# Patient Record
Sex: Male | Born: 1935 | Race: White | Hispanic: No | Smoking: Never smoker
Health system: Southern US, Community
[De-identification: ages and names within clinical notes are randomized; demographics above are authoritative.]

## PROBLEM LIST (undated history)

## (undated) DIAGNOSIS — E7800 Pure hypercholesterolemia, unspecified: Secondary | ICD-10-CM

## (undated) DIAGNOSIS — Z720 Tobacco use: Secondary | ICD-10-CM

---

## 2000-11-27 ENCOUNTER — Encounter: Payer: Self-pay | Admitting: Cardiology

## 2000-11-27 ENCOUNTER — Inpatient Hospital Stay (HOSPITAL_COMMUNITY): Admission: EM | Admit: 2000-11-27 | Discharge: 2000-12-01 | Payer: Self-pay | Admitting: Emergency Medicine

## 2003-06-21 ENCOUNTER — Ambulatory Visit (HOSPITAL_COMMUNITY): Admission: RE | Admit: 2003-06-21 | Discharge: 2003-06-21 | Payer: Self-pay | Admitting: *Deleted

## 2004-07-01 ENCOUNTER — Ambulatory Visit: Payer: Self-pay | Admitting: *Deleted

## 2004-07-24 ENCOUNTER — Ambulatory Visit: Payer: Self-pay | Admitting: *Deleted

## 2005-02-05 ENCOUNTER — Ambulatory Visit: Payer: Self-pay | Admitting: *Deleted

## 2005-02-06 ENCOUNTER — Ambulatory Visit: Payer: Self-pay | Admitting: *Deleted

## 2005-10-31 ENCOUNTER — Ambulatory Visit: Payer: Self-pay | Admitting: *Deleted

## 2005-11-13 ENCOUNTER — Ambulatory Visit: Payer: Self-pay | Admitting: *Deleted

## 2006-11-25 ENCOUNTER — Ambulatory Visit: Payer: Self-pay | Admitting: Cardiology

## 2006-11-25 ENCOUNTER — Ambulatory Visit (HOSPITAL_COMMUNITY): Admission: RE | Admit: 2006-11-25 | Discharge: 2006-11-25 | Payer: Self-pay | Admitting: Family Medicine

## 2006-12-04 ENCOUNTER — Ambulatory Visit: Payer: Self-pay | Admitting: Cardiology

## 2007-03-08 ENCOUNTER — Ambulatory Visit: Payer: Self-pay | Admitting: Cardiology

## 2007-05-20 ENCOUNTER — Observation Stay (HOSPITAL_COMMUNITY): Admission: EM | Admit: 2007-05-20 | Discharge: 2007-05-21 | Payer: Self-pay | Admitting: Emergency Medicine

## 2007-05-24 ENCOUNTER — Ambulatory Visit: Payer: Self-pay | Admitting: Cardiology

## 2007-05-24 ENCOUNTER — Inpatient Hospital Stay (HOSPITAL_COMMUNITY): Admission: AD | Admit: 2007-05-24 | Discharge: 2007-05-28 | Payer: Self-pay | Admitting: Cardiology

## 2007-06-14 ENCOUNTER — Ambulatory Visit: Payer: Self-pay | Admitting: Cardiology

## 2008-10-17 ENCOUNTER — Ambulatory Visit: Payer: Self-pay | Admitting: Cardiology

## 2008-10-26 ENCOUNTER — Ambulatory Visit (HOSPITAL_COMMUNITY): Admission: RE | Admit: 2008-10-26 | Discharge: 2008-10-26 | Payer: Self-pay | Admitting: Cardiology

## 2008-10-27 ENCOUNTER — Ambulatory Visit: Payer: Self-pay | Admitting: Cardiology

## 2008-11-16 ENCOUNTER — Ambulatory Visit: Payer: Self-pay | Admitting: Cardiology

## 2008-12-12 ENCOUNTER — Inpatient Hospital Stay (HOSPITAL_COMMUNITY): Admission: EM | Admit: 2008-12-12 | Discharge: 2008-12-15 | Payer: Self-pay | Admitting: Emergency Medicine

## 2008-12-18 ENCOUNTER — Encounter (INDEPENDENT_AMBULATORY_CARE_PROVIDER_SITE_OTHER): Payer: Self-pay | Admitting: Interventional Radiology

## 2008-12-18 ENCOUNTER — Ambulatory Visit (HOSPITAL_COMMUNITY): Admission: RE | Admit: 2008-12-18 | Discharge: 2008-12-18 | Payer: Self-pay | Admitting: Interventional Radiology

## 2009-03-11 ENCOUNTER — Ambulatory Visit: Payer: Self-pay | Admitting: Cardiology

## 2009-03-11 ENCOUNTER — Inpatient Hospital Stay (HOSPITAL_COMMUNITY): Admission: EM | Admit: 2009-03-11 | Discharge: 2009-03-16 | Payer: Self-pay | Admitting: Emergency Medicine

## 2009-03-18 ENCOUNTER — Ambulatory Visit: Payer: Self-pay | Admitting: Cardiology

## 2009-03-18 ENCOUNTER — Inpatient Hospital Stay (HOSPITAL_COMMUNITY): Admission: EM | Admit: 2009-03-18 | Discharge: 2009-03-30 | Payer: Self-pay | Admitting: Emergency Medicine

## 2009-03-19 ENCOUNTER — Encounter (INDEPENDENT_AMBULATORY_CARE_PROVIDER_SITE_OTHER): Payer: Self-pay | Admitting: Family Medicine

## 2009-03-19 ENCOUNTER — Encounter: Payer: Self-pay | Admitting: Cardiology

## 2009-03-30 ENCOUNTER — Inpatient Hospital Stay: Admission: AD | Admit: 2009-03-30 | Discharge: 2009-06-15 | Payer: Self-pay | Admitting: Family Medicine

## 2009-04-04 ENCOUNTER — Ambulatory Visit (HOSPITAL_COMMUNITY): Admission: RE | Admit: 2009-04-04 | Discharge: 2009-04-04 | Payer: Self-pay | Admitting: Family Medicine

## 2010-02-26 ENCOUNTER — Inpatient Hospital Stay (HOSPITAL_COMMUNITY): Admission: EM | Admit: 2010-02-26 | Discharge: 2010-03-04 | Payer: Self-pay | Admitting: Emergency Medicine

## 2010-03-01 ENCOUNTER — Encounter: Payer: Self-pay | Admitting: Orthopaedic Surgery

## 2010-03-04 ENCOUNTER — Inpatient Hospital Stay: Admission: AD | Admit: 2010-03-04 | Discharge: 2010-03-28 | Payer: Self-pay | Admitting: Family Medicine

## 2010-06-02 IMAGING — CR DG CHEST 1V PORT
1 series · 1 of 1 positions shown · non-contrast
Comparison: 03/18/2009 and earlier.

CLINICAL DATA: [AGE] male with pleural effusions and edema.
Septic shock.

PORTABLE CHEST - 1 VIEW

[view not recorded]
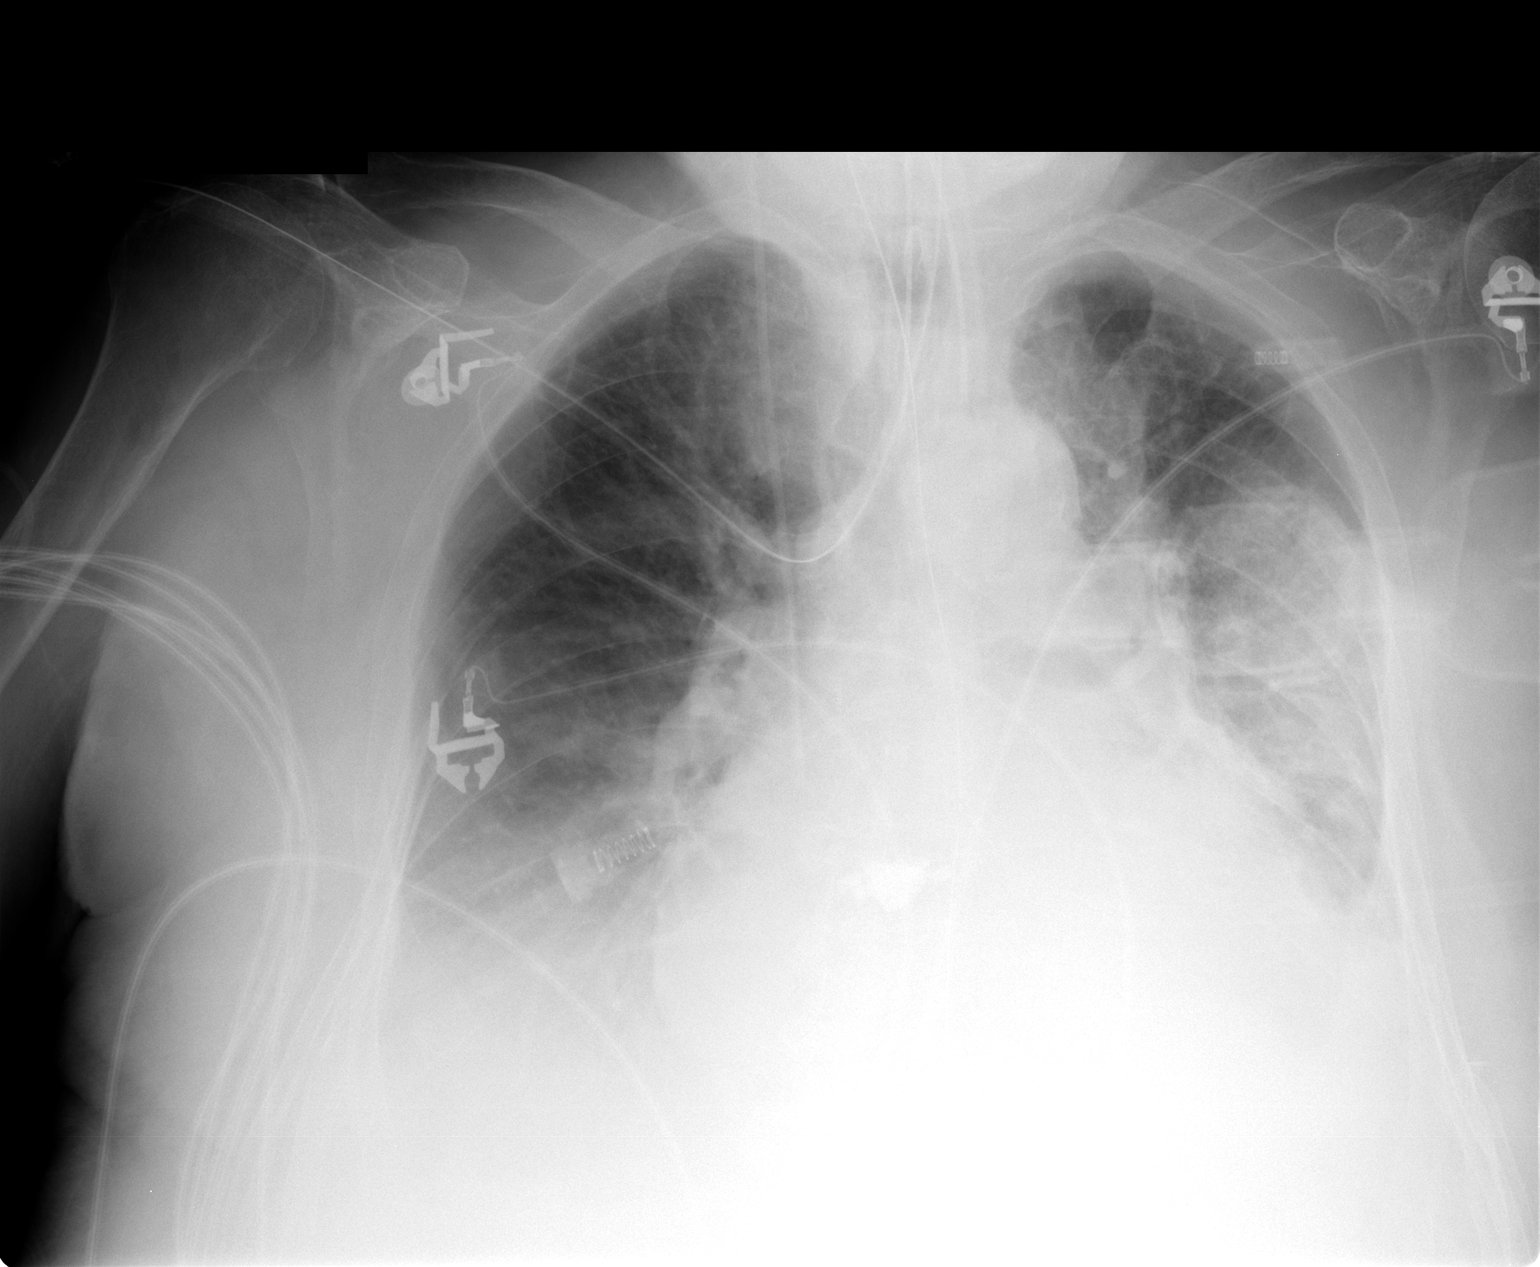

[1 of 1 positions shown; findings below may reference images not displayed]

FINDINGS: Portable semi upright AP view at 5400 hours. Stable
cardiomegaly and mediastinal contours.  Endotracheal tube tip
projects at the level of clavicles.  Stable right IJ catheter
allowing for differences in patient positioning.  Enteric tube tip
projects at the level of the distal thoracic esophagus, unchanged.
Bilateral lower lobe collapse and pleural effusions.  No
pneumothorax or definite acute pulmonary edema.
IMPRESSION: 1.  Stable lines and tubes.  Recommend advancing NG tube for more
optimal placement.
2.  Bilateral pleural effusions and lower lobe collapse.

## 2010-06-02 IMAGING — CR DG ABD PORTABLE 1V
1 series · 1 of 1 positions shown · non-contrast
Comparison: None

CLINICAL DATA: Septic shock.  Colitis.

ABDOMEN - 1 VIEW

[view not recorded]
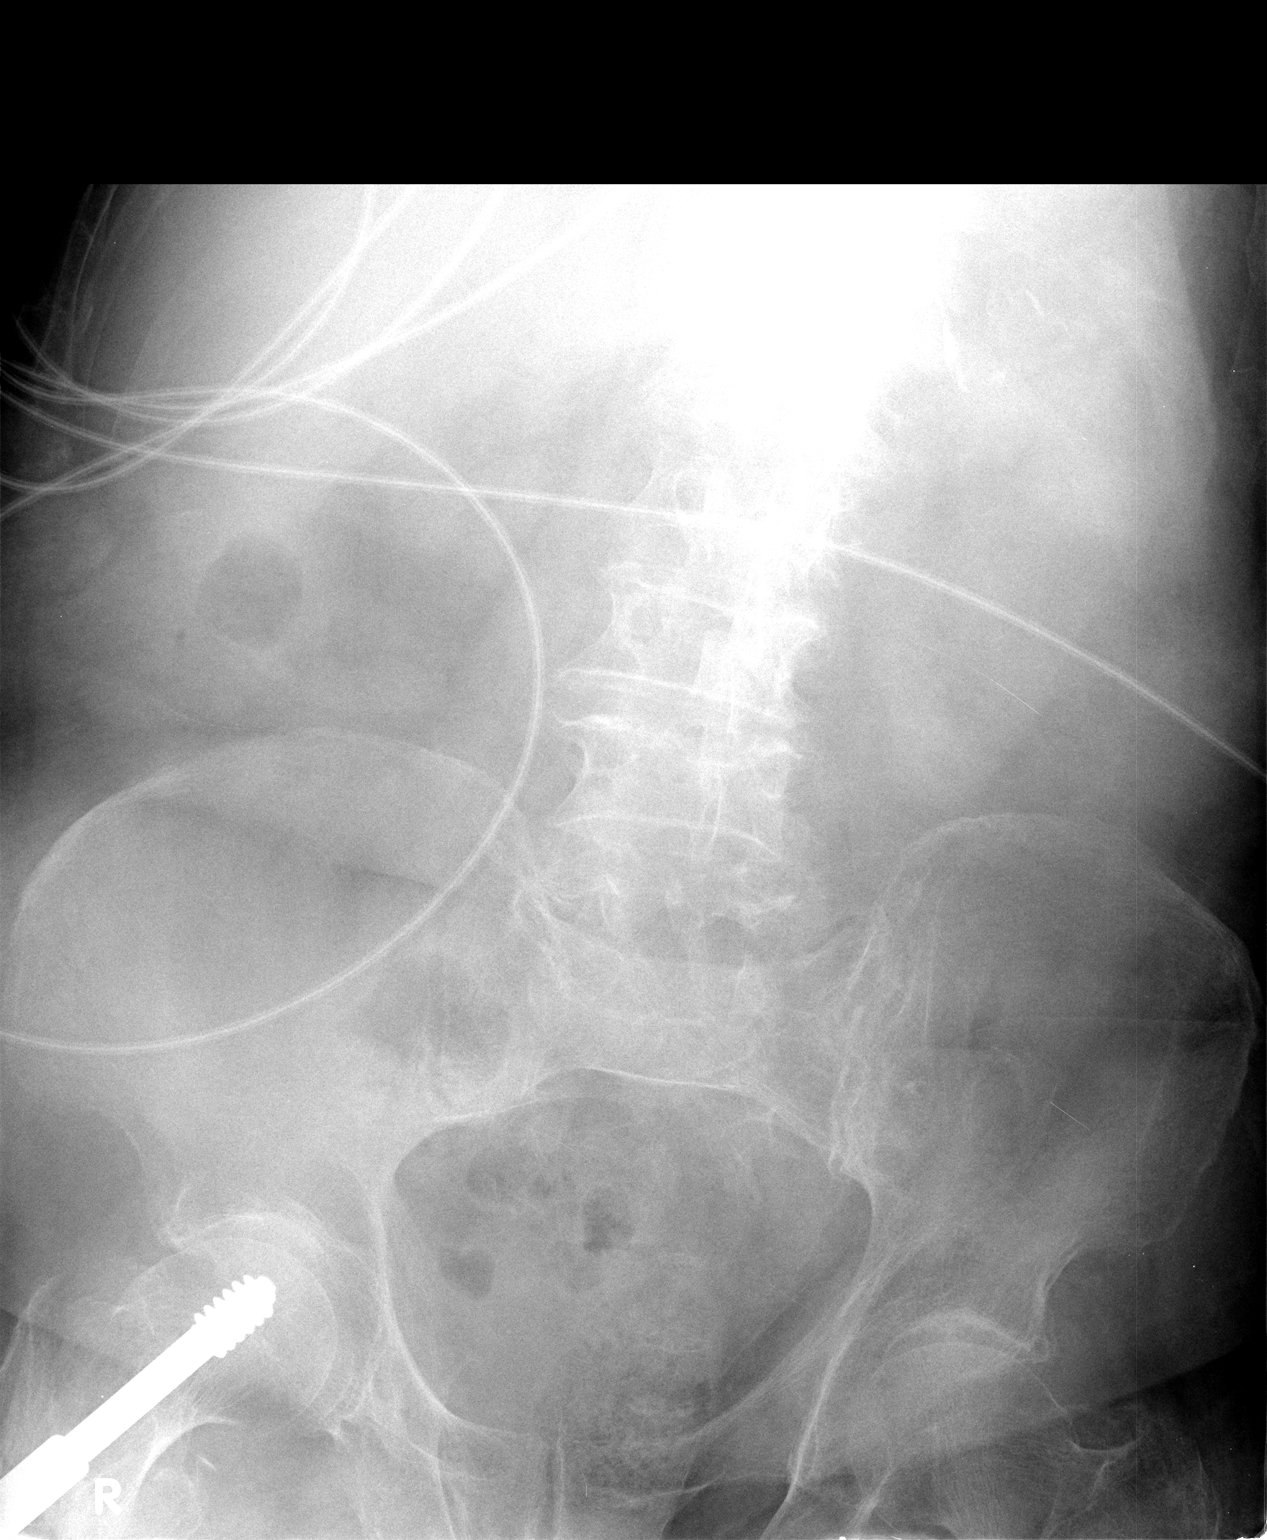

[1 of 1 positions shown; findings below may reference images not displayed]

FINDINGS: There is no evidence of dilated bowel loops.  Vascular
calcification noted within the upper abdomen.  Old T12 vertebral
plasty noted.  Compression screws seen in the right hip.
IMPRESSION: No acute findings.

## 2010-06-04 IMAGING — CR DG CHEST 1V PORT
1 series · 1 of 1 positions shown · non-contrast
Comparison: 03/20/2009.

CLINICAL DATA: Septic shock.

PORTABLE CHEST - 1 VIEW

[view not recorded]
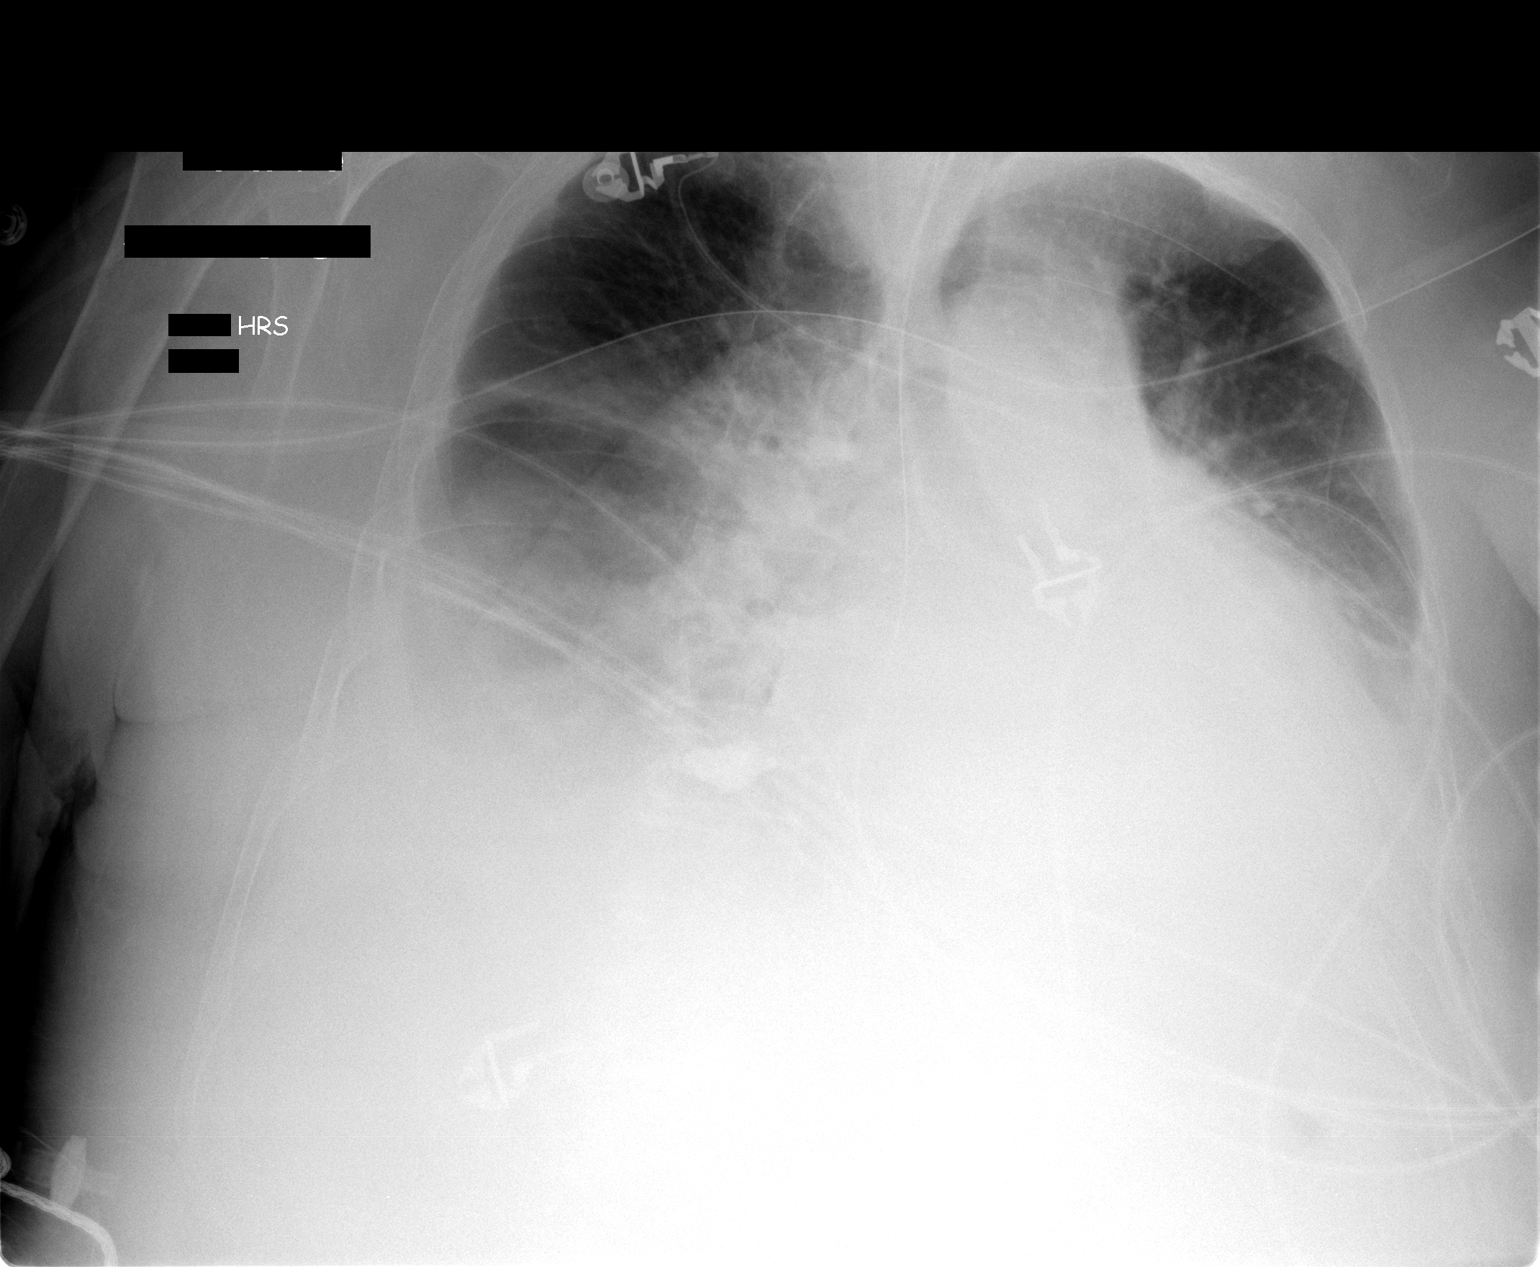

[1 of 1 positions shown; findings below may reference images not displayed]

FINDINGS: Endotracheal tube tip 5.5 cm above the carina.  Right
central line tip there is poorly delineated secondary to rotation
and underpenetration.  Nasogastric tube courses below the
diaphragm.  The tip is not included on this exam.

Cardiomegaly.  Pulmonary vascular congestion.  Consolidation left
lower lobe.  Bilateral pleural effusions.
IMPRESSION: Rotated portable examination.

Cardiomegaly and pulmonary vascular congestion with bilateral
pleural effusions.

Consolidation left base.

## 2010-06-08 IMAGING — CR DG CHEST 1V PORT
1 series · 1 of 1 positions shown · non-contrast
Comparison: 03/22/2009 and 03/20/2009.

CLINICAL DATA: Cough and congestion.  Septic shock.

PORTABLE CHEST - 1 VIEW

[view not recorded]
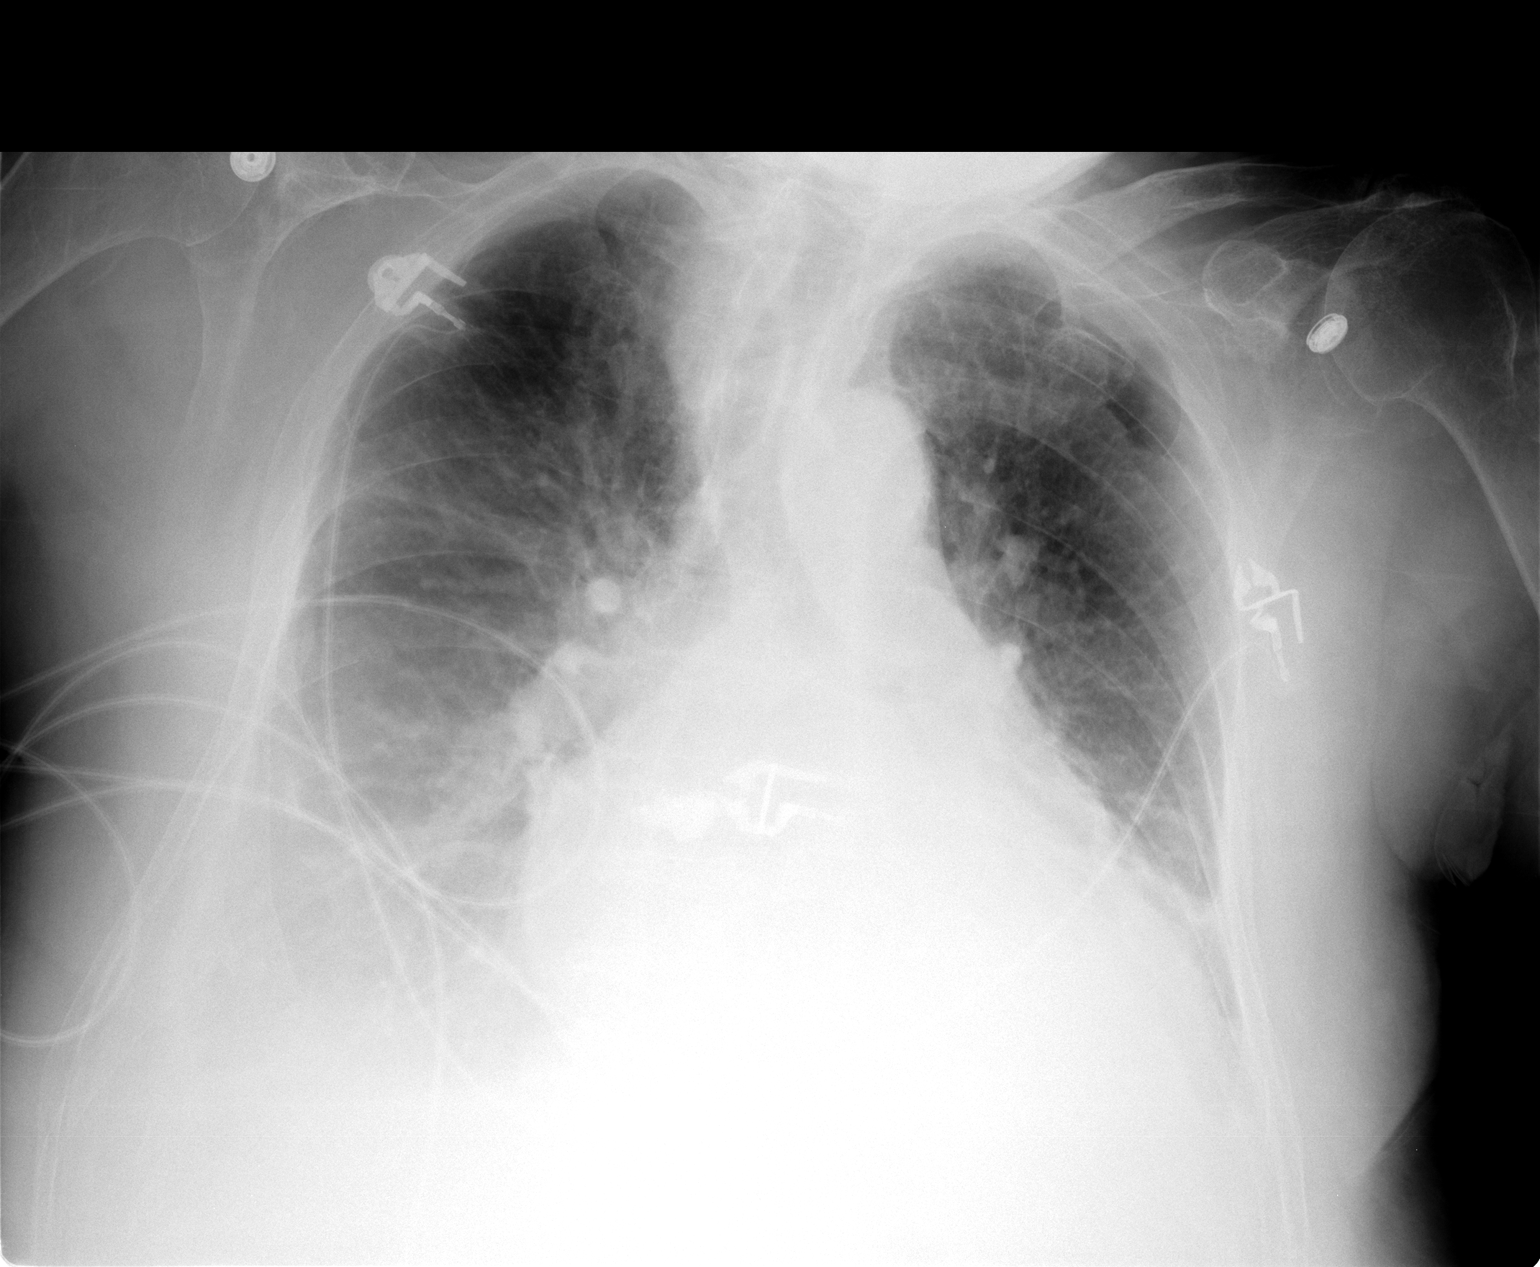

[1 of 1 positions shown; findings below may reference images not displayed]

FINDINGS: 0879 hours.  There has been interval extubation with
removal of the nasogastric tube.  The heart size and mediastinal
contours appear stable.  Bibasilar aeration has improved.  There
are residual bibasilar air space opacities and bilateral pleural
effusions.  Vascular congestion has improved.  There is no
pneumothorax.
IMPRESSION: Improved bilateral air space opacities and pleural effusions.

## 2010-11-10 LAB — CBC
HCT: 41.8 % (ref 39.0–52.0)
Hemoglobin: 14.2 g/dL (ref 13.0–17.0)
MCH: 33.6 pg (ref 26.0–34.0)
MCHC: 34 g/dL (ref 30.0–36.0)
Platelets: 199 10*3/uL (ref 150–400)
Platelets: 202 10*3/uL (ref 150–400)
RDW: 12.9 % (ref 11.5–15.5)
WBC: 6 10*3/uL (ref 4.0–10.5)

## 2010-11-10 LAB — BASIC METABOLIC PANEL
BUN: 25 mg/dL — ABNORMAL HIGH (ref 6–23)
CO2: 28 mEq/L (ref 19–32)
Calcium: 8.5 mg/dL (ref 8.4–10.5)
Calcium: 8.5 mg/dL (ref 8.4–10.5)
Calcium: 8.7 mg/dL (ref 8.4–10.5)
Creatinine, Ser: 1.78 mg/dL — ABNORMAL HIGH (ref 0.4–1.5)
Creatinine, Ser: 1.96 mg/dL — ABNORMAL HIGH (ref 0.4–1.5)
Creatinine, Ser: 2.11 mg/dL — ABNORMAL HIGH (ref 0.4–1.5)
GFR calc non Af Amer: 32 mL/min — ABNORMAL LOW (ref >60)
GFR calc non Af Amer: 36 mL/min — ABNORMAL LOW (ref >60)
Glucose, Bld: 102 mg/dL — ABNORMAL HIGH (ref 70–99)
Glucose, Bld: 89 mg/dL (ref 70–99)
Potassium: 3.6 mEq/L (ref 3.5–5.1)
Sodium: 138 mEq/L (ref 135–145)

## 2010-11-10 LAB — DIFFERENTIAL
Basophils Absolute: 0 10*3/uL (ref 0.0–0.1)
Basophils Relative: 1 % (ref 0–1)
Eosinophils Absolute: 0.1 10*3/uL (ref 0.0–0.7)
Lymphocytes Relative: 25 % (ref 12–46)
Monocytes Relative: 8 % (ref 3–12)
Neutro Abs: 3.9 10*3/uL (ref 1.7–7.7)
Neutrophils Relative %: 65 % (ref 43–77)
Neutrophils Relative %: 69 % (ref 43–77)

## 2010-11-10 LAB — URINE MICROSCOPIC-ADD ON

## 2010-11-10 LAB — PROTIME-INR
INR: 1.05 (ref 0.00–1.49)
Prothrombin Time: 13.9 seconds (ref 11.6–15.2)

## 2010-11-10 LAB — CARDIAC PANEL(CRET KIN+CKTOT+MB+TROPI)
Relative Index: INVALID (ref 0.0–2.5)
Troponin I: 0.09 ng/mL — ABNORMAL HIGH (ref 0.00–0.06)

## 2010-11-10 LAB — URINALYSIS, ROUTINE W REFLEX MICROSCOPIC
Glucose, UA: NEGATIVE mg/dL
Specific Gravity, Urine: 1.02 (ref 1.005–1.030)

## 2010-11-10 LAB — URINE CULTURE: Colony Count: NO GROWTH

## 2010-11-30 LAB — CBC
HCT: 28.8 % — ABNORMAL LOW (ref 39.0–52.0)
HCT: 29 % — ABNORMAL LOW (ref 39.0–52.0)
HCT: 29.1 % — ABNORMAL LOW (ref 39.0–52.0)
Hemoglobin: 10 g/dL — ABNORMAL LOW (ref 13.0–17.0)
Hemoglobin: 10.4 g/dL — ABNORMAL LOW (ref 13.0–17.0)
Hemoglobin: 9.7 g/dL — ABNORMAL LOW (ref 13.0–17.0)
Hemoglobin: 9.8 g/dL — ABNORMAL LOW (ref 13.0–17.0)
MCHC: 34.2 g/dL (ref 30.0–36.0)
MCHC: 34.4 g/dL (ref 30.0–36.0)
MCHC: 34.5 g/dL (ref 30.0–36.0)
MCHC: 34.8 g/dL (ref 30.0–36.0)
MCHC: 35 g/dL (ref 30.0–36.0)
MCV: 100.3 fL — ABNORMAL HIGH (ref 78.0–100.0)
MCV: 100.5 fL — ABNORMAL HIGH (ref 78.0–100.0)
MCV: 101.8 fL — ABNORMAL HIGH (ref 78.0–100.0)
MCV: 99.6 fL (ref 78.0–100.0)
Platelets: 221 10*3/uL (ref 150–400)
Platelets: 330 10*3/uL (ref 150–400)
Platelets: 380 10*3/uL (ref 150–400)
RBC: 2.83 MIL/uL — ABNORMAL LOW (ref 4.22–5.81)
RBC: 2.89 MIL/uL — ABNORMAL LOW (ref 4.22–5.81)
RBC: 2.97 MIL/uL — ABNORMAL LOW (ref 4.22–5.81)
RDW: 14.8 % (ref 11.5–15.5)
RDW: 14.8 % (ref 11.5–15.5)
WBC: 6.7 10*3/uL (ref 4.0–10.5)
WBC: 7.3 10*3/uL (ref 4.0–10.5)

## 2010-11-30 LAB — COMPREHENSIVE METABOLIC PANEL
AST: 26 U/L (ref 0–37)
Albumin: 1.6 g/dL — ABNORMAL LOW (ref 3.5–5.2)
BUN: 23 mg/dL (ref 6–23)
CO2: 25 mEq/L (ref 19–32)
Calcium: 7.9 mg/dL — ABNORMAL LOW (ref 8.4–10.5)
Creatinine, Ser: 1.38 mg/dL (ref 0.4–1.5)
GFR calc Af Amer: 58 mL/min — ABNORMAL LOW (ref >60)
GFR calc non Af Amer: 48 mL/min — ABNORMAL LOW (ref >60)

## 2010-11-30 LAB — DIFFERENTIAL
Basophils Absolute: 0 10*3/uL (ref 0.0–0.1)
Basophils Absolute: 0 10*3/uL (ref 0.0–0.1)
Basophils Absolute: 0 10*3/uL (ref 0.0–0.1)
Basophils Relative: 0 % (ref 0–1)
Basophils Relative: 0 % (ref 0–1)
Basophils Relative: 0 % (ref 0–1)
Basophils Relative: 0 % (ref 0–1)
Eosinophils Absolute: 0.1 10*3/uL (ref 0.0–0.7)
Eosinophils Absolute: 0.2 10*3/uL (ref 0.0–0.7)
Eosinophils Absolute: 0.2 10*3/uL (ref 0.0–0.7)
Eosinophils Relative: 3 % (ref 0–5)
Eosinophils Relative: 3 % (ref 0–5)
Lymphocytes Relative: 10 % — ABNORMAL LOW (ref 12–46)
Lymphocytes Relative: 11 % — ABNORMAL LOW (ref 12–46)
Lymphocytes Relative: 22 % (ref 12–46)
Lymphs Abs: 0.7 10*3/uL (ref 0.7–4.0)
Lymphs Abs: 0.8 10*3/uL (ref 0.7–4.0)
Lymphs Abs: 0.8 10*3/uL (ref 0.7–4.0)
Monocytes Absolute: 0.6 10*3/uL (ref 0.1–1.0)
Monocytes Absolute: 0.7 10*3/uL (ref 0.1–1.0)
Monocytes Absolute: 0.7 10*3/uL (ref 0.1–1.0)
Monocytes Relative: 10 % (ref 3–12)
Monocytes Relative: 10 % (ref 3–12)
Monocytes Relative: 12 % (ref 3–12)
Neutro Abs: 3.3 10*3/uL (ref 1.7–7.7)
Neutro Abs: 5 10*3/uL (ref 1.7–7.7)
Neutro Abs: 6 10*3/uL (ref 1.7–7.7)
Neutro Abs: 6.1 10*3/uL (ref 1.7–7.7)
Neutrophils Relative %: 62 % (ref 43–77)
Neutrophils Relative %: 75 % (ref 43–77)
Neutrophils Relative %: 77 % (ref 43–77)
Neutrophils Relative %: 78 % — ABNORMAL HIGH (ref 43–77)

## 2010-11-30 LAB — HEPATIC FUNCTION PANEL
ALT: 17 U/L (ref 0–53)
ALT: 18 U/L (ref 0–53)
AST: 24 U/L (ref 0–37)
AST: 27 U/L (ref 0–37)
AST: 32 U/L (ref 0–37)
Albumin: 1.6 g/dL — ABNORMAL LOW (ref 3.5–5.2)
Albumin: 1.6 g/dL — ABNORMAL LOW (ref 3.5–5.2)
Albumin: 1.6 g/dL — ABNORMAL LOW (ref 3.5–5.2)
Albumin: 1.7 g/dL — ABNORMAL LOW (ref 3.5–5.2)
Alkaline Phosphatase: 141 U/L — ABNORMAL HIGH (ref 39–117)
Bilirubin, Direct: 0.4 mg/dL — ABNORMAL HIGH (ref 0.0–0.3)
Bilirubin, Direct: 0.6 mg/dL — ABNORMAL HIGH (ref 0.0–0.3)
Indirect Bilirubin: 0.6 mg/dL (ref 0.3–0.9)
Total Bilirubin: 0.9 mg/dL (ref 0.3–1.2)
Total Bilirubin: 1.2 mg/dL (ref 0.3–1.2)
Total Protein: 4.9 g/dL — ABNORMAL LOW (ref 6.0–8.3)
Total Protein: 5 g/dL — ABNORMAL LOW (ref 6.0–8.3)

## 2010-11-30 LAB — BASIC METABOLIC PANEL
BUN: 16 mg/dL (ref 6–23)
CO2: 28 mEq/L (ref 19–32)
CO2: 30 mEq/L (ref 19–32)
CO2: 30 mEq/L (ref 19–32)
CO2: 30 mEq/L (ref 19–32)
Calcium: 7.8 mg/dL — ABNORMAL LOW (ref 8.4–10.5)
Calcium: 8 mg/dL — ABNORMAL LOW (ref 8.4–10.5)
Chloride: 100 mEq/L (ref 96–112)
Chloride: 102 mEq/L (ref 96–112)
Creatinine, Ser: 1.31 mg/dL (ref 0.4–1.5)
Creatinine, Ser: 1.34 mg/dL (ref 0.4–1.5)
GFR calc Af Amer: >60 mL/min (ref >60)
GFR calc Af Amer: >60 mL/min (ref >60)
GFR calc Af Amer: >60 mL/min (ref >60)
GFR calc non Af Amer: 50 mL/min — ABNORMAL LOW (ref >60)
GFR calc non Af Amer: 57 mL/min — ABNORMAL LOW (ref >60)
Glucose, Bld: 108 mg/dL — ABNORMAL HIGH (ref 70–99)
Glucose, Bld: 99 mg/dL (ref 70–99)
Potassium: 3.5 mEq/L (ref 3.5–5.1)
Potassium: 3.9 mEq/L (ref 3.5–5.1)
Sodium: 131 mEq/L — ABNORMAL LOW (ref 135–145)
Sodium: 133 mEq/L — ABNORMAL LOW (ref 135–145)
Sodium: 134 mEq/L — ABNORMAL LOW (ref 135–145)
Sodium: 136 mEq/L (ref 135–145)

## 2010-12-01 LAB — DIFFERENTIAL
Basophils Absolute: 0 10*3/uL (ref 0.0–0.1)
Basophils Absolute: 0 10*3/uL (ref 0.0–0.1)
Basophils Relative: 0 % (ref 0–1)
Basophils Relative: 0 % (ref 0–1)
Basophils Relative: 0 % (ref 0–1)
Blasts: 0 %
Eosinophils Absolute: 0 10*3/uL (ref 0.0–0.7)
Eosinophils Absolute: 0.1 10*3/uL (ref 0.0–0.7)
Eosinophils Absolute: 0.1 10*3/uL (ref 0.0–0.7)
Eosinophils Absolute: 0.2 10*3/uL (ref 0.0–0.7)
Eosinophils Relative: 0 % (ref 0–5)
Eosinophils Relative: 0 % (ref 0–5)
Eosinophils Relative: 0 % (ref 0–5)
Eosinophils Relative: 1 % (ref 0–5)
Lymphocytes Relative: 2 % — ABNORMAL LOW (ref 12–46)
Lymphocytes Relative: 4 % — ABNORMAL LOW (ref 12–46)
Lymphocytes Relative: 6 % — ABNORMAL LOW (ref 12–46)
Lymphocytes Relative: 8 % — ABNORMAL LOW (ref 12–46)
Lymphocytes Relative: 9 % — ABNORMAL LOW (ref 12–46)
Lymphs Abs: 0.8 10*3/uL (ref 0.7–4.0)
Lymphs Abs: 0.2 10*3/uL — ABNORMAL LOW (ref 0.7–4.0)
Lymphs Abs: 0.7 10*3/uL (ref 0.7–4.0)
Lymphs Abs: 0.7 10*3/uL (ref 0.7–4.0)
Lymphs Abs: 0.9 10*3/uL (ref 0.7–4.0)
Lymphs Abs: 1.2 10*3/uL (ref 0.7–4.0)
Metamyelocytes Relative: 0 %
Monocytes Absolute: 0.4 10*3/uL (ref 0.1–1.0)
Monocytes Absolute: 0.5 10*3/uL (ref 0.1–1.0)
Monocytes Absolute: 1 10*3/uL (ref 0.1–1.0)
Monocytes Absolute: 1.1 10*3/uL — ABNORMAL HIGH (ref 0.1–1.0)
Monocytes Relative: 8 % (ref 3–12)
Monocytes Relative: 2 % — ABNORMAL LOW (ref 3–12)
Monocytes Relative: 3 % (ref 3–12)
Monocytes Relative: 3 % (ref 3–12)
Monocytes Relative: 7 % (ref 3–12)
Myelocytes: 0 %
Neutro Abs: 5.5 10*3/uL (ref 1.7–7.7)
Neutro Abs: 21.8 10*3/uL — ABNORMAL HIGH (ref 1.7–7.7)
Neutro Abs: 3.7 10*3/uL (ref 1.7–7.7)
Neutrophils Relative %: 80 % — ABNORMAL HIGH (ref 43–77)
Neutrophils Relative %: 80 % — ABNORMAL HIGH (ref 43–77)
Neutrophils Relative %: 84 % — ABNORMAL HIGH (ref 43–77)
Neutrophils Relative %: 88 % — ABNORMAL HIGH (ref 43–77)
Neutrophils Relative %: 91 % — ABNORMAL HIGH (ref 43–77)
Neutrophils Relative %: 96 % — ABNORMAL HIGH (ref 43–77)
WBC Morphology: INCREASED
WBC Morphology: INCREASED
nRBC: 0 /100 WBC

## 2010-12-01 LAB — HEPATIC FUNCTION PANEL
ALT: 27 U/L (ref 0–53)
ALT: 36 U/L (ref 0–53)
ALT: 48 U/L (ref 0–53)
ALT: 73 U/L — ABNORMAL HIGH (ref 0–53)
AST: 237 U/L — ABNORMAL HIGH (ref 0–37)
AST: 57 U/L — ABNORMAL HIGH (ref 0–37)
Albumin: 1.4 g/dL — ABNORMAL LOW (ref 3.5–5.2)
Albumin: 1.4 g/dL — ABNORMAL LOW (ref 3.5–5.2)
Albumin: 1.6 g/dL — ABNORMAL LOW (ref 3.5–5.2)
Alkaline Phosphatase: 128 U/L — ABNORMAL HIGH (ref 39–117)
Alkaline Phosphatase: 150 U/L — ABNORMAL HIGH (ref 39–117)
Alkaline Phosphatase: 159 U/L — ABNORMAL HIGH (ref 39–117)
Bilirubin, Direct: 2.6 mg/dL — ABNORMAL HIGH (ref 0.0–0.3)
Indirect Bilirubin: 0.8 mg/dL (ref 0.3–0.9)
Indirect Bilirubin: 0.8 mg/dL (ref 0.3–0.9)
Indirect Bilirubin: 1 mg/dL — ABNORMAL HIGH (ref 0.3–0.9)
Total Bilirubin: 1.5 mg/dL — ABNORMAL HIGH (ref 0.3–1.2)
Total Bilirubin: 3.3 mg/dL — ABNORMAL HIGH (ref 0.3–1.2)
Total Bilirubin: 3.6 mg/dL — ABNORMAL HIGH (ref 0.3–1.2)
Total Protein: 4.1 g/dL — ABNORMAL LOW (ref 6.0–8.3)
Total Protein: 4.5 g/dL — ABNORMAL LOW (ref 6.0–8.3)
Total Protein: 5 g/dL — ABNORMAL LOW (ref 6.0–8.3)

## 2010-12-01 LAB — CARBOXYHEMOGLOBIN
Carboxyhemoglobin: 0.9 % (ref 0.5–1.5)
Carboxyhemoglobin: 1 % (ref 0.5–1.5)
Carboxyhemoglobin: 1 % (ref 0.5–1.5)
Carboxyhemoglobin: 1 % (ref 0.5–1.5)
Carboxyhemoglobin: 1.1 % (ref 0.5–1.5)
Carboxyhemoglobin: 1.1 % (ref 0.5–1.5)
Methemoglobin: 1.6 % — ABNORMAL HIGH (ref 0.0–1.5)
Methemoglobin: 1.7 % — ABNORMAL HIGH (ref 0.0–1.5)
O2 Saturation: 56.3 %
O2 Saturation: 58.6 %
O2 Saturation: 59.4 %
O2 Saturation: 61.1 %
O2 Saturation: 61.2 %
Total hemoglobin: 10 g/dL — ABNORMAL LOW (ref 13.5–18.0)
Total hemoglobin: 10.2 g/dL — ABNORMAL LOW (ref 13.5–18.0)
Total hemoglobin: 10.3 g/dL — ABNORMAL LOW (ref 13.5–18.0)
Total hemoglobin: 10.5 g/dL — ABNORMAL LOW (ref 13.5–18.0)
Total hemoglobin: 9.9 g/dL — ABNORMAL LOW (ref 13.5–18.0)
Total oxygen content: 8.2 mL/dL — ABNORMAL LOW (ref 15.0–23.0)
Total oxygen content: 8.7 mL/dL — ABNORMAL LOW (ref 15.0–23.0)
Total oxygen content: 9.2 mL/dL — ABNORMAL LOW (ref 15.0–23.0)
Total oxygen content: 9.4 mL/dL — ABNORMAL LOW (ref 15.0–23.0)

## 2010-12-01 LAB — BASIC METABOLIC PANEL
BUN: 25 mg/dL — ABNORMAL HIGH (ref 6–23)
BUN: 29 mg/dL — ABNORMAL HIGH (ref 6–23)
BUN: 27 mg/dL — ABNORMAL HIGH (ref 6–23)
BUN: 35 mg/dL — ABNORMAL HIGH (ref 6–23)
BUN: 37 mg/dL — ABNORMAL HIGH (ref 6–23)
BUN: 39 mg/dL — ABNORMAL HIGH (ref 6–23)
BUN: 42 mg/dL — ABNORMAL HIGH (ref 6–23)
BUN: 43 mg/dL — ABNORMAL HIGH (ref 6–23)
CO2: 21 mEq/L (ref 19–32)
CO2: 22 mEq/L (ref 19–32)
Calcium: 8.2 mg/dL — ABNORMAL LOW (ref 8.4–10.5)
Calcium: 7.4 mg/dL — ABNORMAL LOW (ref 8.4–10.5)
Chloride: 102 mEq/L (ref 96–112)
Chloride: 106 mEq/L (ref 96–112)
Chloride: 108 mEq/L (ref 96–112)
Chloride: 110 mEq/L (ref 96–112)
Chloride: 112 mEq/L (ref 96–112)
Creatinine, Ser: 1.35 mg/dL (ref 0.4–1.5)
Creatinine, Ser: 1.66 mg/dL — ABNORMAL HIGH (ref 0.4–1.5)
Creatinine, Ser: 1.82 mg/dL — ABNORMAL HIGH (ref 0.4–1.5)
Creatinine, Ser: 1.92 mg/dL — ABNORMAL HIGH (ref 0.4–1.5)
GFR calc Af Amer: 42 mL/min — ABNORMAL LOW (ref >60)
GFR calc Af Amer: 43 mL/min — ABNORMAL LOW (ref >60)
GFR calc Af Amer: 47 mL/min — ABNORMAL LOW (ref >60)
GFR calc non Af Amer: 35 mL/min — ABNORMAL LOW (ref >60)
GFR calc non Af Amer: 41 mL/min — ABNORMAL LOW (ref >60)
GFR calc non Af Amer: 33 mL/min — ABNORMAL LOW (ref >60)
GFR calc non Af Amer: 36 mL/min — ABNORMAL LOW (ref >60)
GFR calc non Af Amer: 39 mL/min — ABNORMAL LOW (ref >60)
Glucose, Bld: 118 mg/dL — ABNORMAL HIGH (ref 70–99)
Glucose, Bld: 113 mg/dL — ABNORMAL HIGH (ref 70–99)
Glucose, Bld: 76 mg/dL (ref 70–99)
Glucose, Bld: 88 mg/dL (ref 70–99)
Potassium: 3.8 mEq/L (ref 3.5–5.1)
Potassium: 4.3 mEq/L (ref 3.5–5.1)
Potassium: 3.3 mEq/L — ABNORMAL LOW (ref 3.5–5.1)
Potassium: 3.5 mEq/L (ref 3.5–5.1)
Potassium: 3.8 mEq/L (ref 3.5–5.1)
Potassium: 4 mEq/L (ref 3.5–5.1)
Sodium: 133 mEq/L — ABNORMAL LOW (ref 135–145)
Sodium: 135 mEq/L (ref 135–145)
Sodium: 139 mEq/L (ref 135–145)

## 2010-12-01 LAB — BLOOD GAS, ARTERIAL
Acid-base deficit: 5.1 mmol/L — ABNORMAL HIGH (ref 0.0–2.0)
Acid-base deficit: 5.1 mmol/L — ABNORMAL HIGH (ref 0.0–2.0)
Acid-base deficit: 6.1 mmol/L — ABNORMAL HIGH (ref 0.0–2.0)
Acid-base deficit: 6.5 mmol/L — ABNORMAL HIGH (ref 0.0–2.0)
Acid-base deficit: 7.3 mmol/L — ABNORMAL HIGH (ref 0.0–2.0)
Acid-base deficit: 8.7 mmol/L — ABNORMAL HIGH (ref 0.0–2.0)
Bicarbonate: 16 mEq/L — ABNORMAL LOW (ref 20.0–24.0)
Bicarbonate: 17.9 mEq/L — ABNORMAL LOW (ref 20.0–24.0)
Bicarbonate: 19.2 mEq/L — ABNORMAL LOW (ref 20.0–24.0)
FIO2: 0.5 %
FIO2: 40 %
FIO2: 40 %
FIO2: 50 %
MECHVT: 550 mL
MECHVT: 550 mL
MECHVT: 550 mL
MECHVT: 700 mL
O2 Content: 40 L/min
O2 Saturation: 98.3 %
O2 Saturation: 98.8 %
O2 Saturation: 98.9 %
PEEP: 5 cmH2O
PEEP: 5 cmH2O
PEEP: 5 cmH2O
Patient temperature: 37
Patient temperature: 37
Patient temperature: 37
Patient temperature: 37
Pressure support: 10 cmH2O
RATE: 14 resp/min
RATE: 14 resp/min
RATE: 14 resp/min
TCO2: 14.5 mmol/L (ref 0–100)
TCO2: 15 mmol/L (ref 0–100)
TCO2: 15 mmol/L (ref 0–100)
TCO2: 16.4 mmol/L (ref 0–100)
pCO2 arterial: 25 mmHg — ABNORMAL LOW (ref 35.0–45.0)
pCO2 arterial: 25.3 mmHg — ABNORMAL LOW (ref 35.0–45.0)
pCO2 arterial: 26.3 mmHg — ABNORMAL LOW (ref 35.0–45.0)
pCO2 arterial: 29 mmHg — ABNORMAL LOW (ref 35.0–45.0)
pCO2 arterial: 30.1 mmHg — ABNORMAL LOW (ref 35.0–45.0)
pCO2 arterial: 31.1 mmHg — ABNORMAL LOW (ref 35.0–45.0)
pCO2 arterial: 32.7 mmHg — ABNORMAL LOW (ref 35.0–45.0)
pH, Arterial: 7.342 — ABNORMAL LOW (ref 7.350–7.450)
pH, Arterial: 7.387 (ref 7.350–7.450)
pH, Arterial: 7.402 (ref 7.350–7.450)
pO2, Arterial: 109 mmHg — ABNORMAL HIGH (ref 80.0–100.0)
pO2, Arterial: 128 mmHg — ABNORMAL HIGH (ref 80.0–100.0)
pO2, Arterial: 134 mmHg — ABNORMAL HIGH (ref 80.0–100.0)
pO2, Arterial: 142 mmHg — ABNORMAL HIGH (ref 80.0–100.0)

## 2010-12-01 LAB — CULTURE, BLOOD (ROUTINE X 2)

## 2010-12-01 LAB — CBC
HCT: 24.2 % — ABNORMAL LOW (ref 39.0–52.0)
HCT: 25.6 % — ABNORMAL LOW (ref 39.0–52.0)
HCT: 29 % — ABNORMAL LOW (ref 39.0–52.0)
HCT: 34 % — ABNORMAL LOW (ref 39.0–52.0)
HCT: 27.5 % — ABNORMAL LOW (ref 39.0–52.0)
HCT: 27.8 % — ABNORMAL LOW (ref 39.0–52.0)
HCT: 28.8 % — ABNORMAL LOW (ref 39.0–52.0)
HCT: 29.8 % — ABNORMAL LOW (ref 39.0–52.0)
Hemoglobin: 11.8 g/dL — ABNORMAL LOW (ref 13.0–17.0)
Hemoglobin: 8.3 g/dL — ABNORMAL LOW (ref 13.0–17.0)
Hemoglobin: 9 g/dL — ABNORMAL LOW (ref 13.0–17.0)
Hemoglobin: 10.1 g/dL — ABNORMAL LOW (ref 13.0–17.0)
Hemoglobin: 9.7 g/dL — ABNORMAL LOW (ref 13.0–17.0)
MCHC: 34.1 g/dL (ref 30.0–36.0)
MCHC: 34.7 g/dL (ref 30.0–36.0)
MCHC: 34.5 g/dL (ref 30.0–36.0)
MCHC: 34.8 g/dL (ref 30.0–36.0)
MCHC: 34.8 g/dL (ref 30.0–36.0)
MCHC: 35.3 g/dL (ref 30.0–36.0)
MCV: 101 fL — ABNORMAL HIGH (ref 78.0–100.0)
MCV: 101.3 fL — ABNORMAL HIGH (ref 78.0–100.0)
MCV: 102.4 fL — ABNORMAL HIGH (ref 78.0–100.0)
MCV: 100.3 fL — ABNORMAL HIGH (ref 78.0–100.0)
MCV: 100.3 fL — ABNORMAL HIGH (ref 78.0–100.0)
MCV: 99 fL (ref 78.0–100.0)
MCV: 99.2 fL (ref 78.0–100.0)
MCV: 99.4 fL (ref 78.0–100.0)
Platelets: 121 10*3/uL — ABNORMAL LOW (ref 150–400)
Platelets: 126 10*3/uL — ABNORMAL LOW (ref 150–400)
Platelets: 144 10*3/uL — ABNORMAL LOW (ref 150–400)
Platelets: 146 10*3/uL — ABNORMAL LOW (ref 150–400)
Platelets: 165 10*3/uL (ref 150–400)
Platelets: 185 10*3/uL (ref 150–400)
Platelets: 205 10*3/uL (ref 150–400)
RBC: 2.53 MIL/uL — ABNORMAL LOW (ref 4.22–5.81)
RBC: 2.61 MIL/uL — ABNORMAL LOW (ref 4.22–5.81)
RBC: 2.64 MIL/uL — ABNORMAL LOW (ref 4.22–5.81)
RBC: 2.87 MIL/uL — ABNORMAL LOW (ref 4.22–5.81)
RBC: 3.01 MIL/uL — ABNORMAL LOW (ref 4.22–5.81)
RDW: 13.4 % (ref 11.5–15.5)
RDW: 13.5 % (ref 11.5–15.5)
RDW: 14.1 % (ref 11.5–15.5)
RDW: 14.5 % (ref 11.5–15.5)
RDW: 14.9 % (ref 11.5–15.5)
WBC: 6 10*3/uL (ref 4.0–10.5)
WBC: 6.5 10*3/uL (ref 4.0–10.5)
WBC: 11.7 10*3/uL — ABNORMAL HIGH (ref 4.0–10.5)
WBC: 20.2 10*3/uL — ABNORMAL HIGH (ref 4.0–10.5)
WBC: 22.8 10*3/uL — ABNORMAL HIGH (ref 4.0–10.5)
WBC: 3.9 10*3/uL — ABNORMAL LOW (ref 4.0–10.5)
WBC: 33.3 10*3/uL — ABNORMAL HIGH (ref 4.0–10.5)
WBC: 8.1 10*3/uL (ref 4.0–10.5)

## 2010-12-01 LAB — COMPREHENSIVE METABOLIC PANEL
ALT: 31 U/L (ref 0–53)
AST: 238 U/L — ABNORMAL HIGH (ref 0–37)
AST: 47 U/L — ABNORMAL HIGH (ref 0–37)
Albumin: 1.4 g/dL — ABNORMAL LOW (ref 3.5–5.2)
BUN: 26 mg/dL — ABNORMAL HIGH (ref 6–23)
BUN: 35 mg/dL — ABNORMAL HIGH (ref 6–23)
CO2: 27 mEq/L (ref 19–32)
CO2: 23 mEq/L (ref 19–32)
CO2: 24 mEq/L (ref 19–32)
Calcium: 8.8 mg/dL (ref 8.4–10.5)
Calcium: 7.5 mg/dL — ABNORMAL LOW (ref 8.4–10.5)
Calcium: 8.2 mg/dL — ABNORMAL LOW (ref 8.4–10.5)
Chloride: 103 mEq/L (ref 96–112)
Chloride: 110 mEq/L (ref 96–112)
Creatinine, Ser: 1.69 mg/dL — ABNORMAL HIGH (ref 0.4–1.5)
Creatinine, Ser: 1.77 mg/dL — ABNORMAL HIGH (ref 0.4–1.5)
GFR calc Af Amer: 44 mL/min — ABNORMAL LOW (ref >60)
GFR calc Af Amer: 46 mL/min — ABNORMAL LOW (ref >60)
GFR calc non Af Amer: 37 mL/min — ABNORMAL LOW (ref >60)
GFR calc non Af Amer: 38 mL/min — ABNORMAL LOW (ref >60)
Glucose, Bld: 107 mg/dL — ABNORMAL HIGH (ref 70–99)
Sodium: 138 mEq/L (ref 135–145)
Total Bilirubin: 3.4 mg/dL — ABNORMAL HIGH (ref 0.3–1.2)
Total Protein: 6.7 g/dL (ref 6.0–8.3)

## 2010-12-01 LAB — CROSSMATCH: ABO/RH(D): B POS

## 2010-12-01 LAB — URINALYSIS, ROUTINE W REFLEX MICROSCOPIC
Bilirubin Urine: NEGATIVE
Nitrite: NEGATIVE
Specific Gravity, Urine: 1.01 (ref 1.005–1.030)
Urobilinogen, UA: 1 mg/dL (ref 0.0–1.0)
pH: 7.5 (ref 5.0–8.0)

## 2010-12-01 LAB — AMYLASE: Amylase: 50 U/L (ref 27–131)

## 2010-12-01 LAB — MAGNESIUM: Magnesium: 1.8 mg/dL (ref 1.5–2.5)

## 2010-12-01 LAB — PROTIME-INR
INR: 1.2 (ref 0.00–1.49)
INR: 1.4 (ref 0.00–1.49)
Prothrombin Time: 15.9 seconds — ABNORMAL HIGH (ref 11.6–15.2)
Prothrombin Time: 17.9 seconds — ABNORMAL HIGH (ref 11.6–15.2)

## 2010-12-01 LAB — CARDIAC PANEL(CRET KIN+CKTOT+MB+TROPI)
CK, MB: 4.5 ng/mL — ABNORMAL HIGH (ref 0.3–4.0)
CK, MB: 9.4 ng/mL — ABNORMAL HIGH (ref 0.3–4.0)
Relative Index: 2.2 (ref 0.0–2.5)
Relative Index: 2.2 (ref 0.0–2.5)
Relative Index: 2.3 (ref 0.0–2.5)
Total CK: 275 U/L — ABNORMAL HIGH (ref 7–232)
Total CK: 428 U/L — ABNORMAL HIGH (ref 7–232)
Troponin I: 2.15 ng/mL (ref 0.00–0.06)
Troponin I: 2.61 ng/mL (ref 0.00–0.06)
Troponin I: 3.09 ng/mL (ref 0.00–0.06)

## 2010-12-01 LAB — URINE CULTURE: Colony Count: >=100000

## 2010-12-01 LAB — HEMOGLOBIN AND HEMATOCRIT, BLOOD
HCT: 28.2 % — ABNORMAL LOW (ref 39.0–52.0)
Hemoglobin: 9.4 g/dL — ABNORMAL LOW (ref 13.0–17.0)

## 2010-12-01 LAB — APTT: aPTT: 30 seconds (ref 24–37)

## 2010-12-01 LAB — D-DIMER, QUANTITATIVE: D-Dimer, Quant: >20 ug/mL-FEU — ABNORMAL HIGH (ref 0.00–0.48)

## 2010-12-01 LAB — BRAIN NATRIURETIC PEPTIDE: Pro B Natriuretic peptide (BNP): 438 pg/mL — ABNORMAL HIGH (ref 0.0–100.0)

## 2010-12-01 LAB — LACTIC ACID, PLASMA: Lactic Acid, Venous: 3.8 mmol/L — ABNORMAL HIGH (ref 0.5–2.2)

## 2010-12-01 LAB — TYPE AND SCREEN

## 2010-12-01 LAB — URINE MICROSCOPIC-ADD ON

## 2010-12-04 LAB — VITAMIN D 1,25 DIHYDROXY: Vitamin D 1, 25 (OH)2 Total: 17 pg/mL — ABNORMAL LOW (ref 18–72)

## 2010-12-04 LAB — URINE MICROSCOPIC-ADD ON

## 2010-12-04 LAB — CBC
HCT: 44.8 % (ref 39.0–52.0)
HCT: 41.9 % (ref 39.0–52.0)
MCV: 99.3 fL (ref 78.0–100.0)
Platelets: 186 10*3/uL (ref 150–400)
RBC: 4.22 MIL/uL (ref 4.22–5.81)
WBC: 5.8 10*3/uL (ref 4.0–10.5)
WBC: 5 10*3/uL (ref 4.0–10.5)

## 2010-12-04 LAB — URINE CULTURE

## 2010-12-04 LAB — URINALYSIS, ROUTINE W REFLEX MICROSCOPIC
Bilirubin Urine: NEGATIVE
Nitrite: POSITIVE — AB
Specific Gravity, Urine: >1.03 — ABNORMAL HIGH (ref 1.005–1.030)
pH: 6 (ref 5.0–8.0)

## 2010-12-04 LAB — DIFFERENTIAL
Eosinophils Absolute: 0.1 10*3/uL (ref 0.0–0.7)
Eosinophils Relative: 2 % (ref 0–5)
Lymphocytes Relative: 20 % (ref 12–46)
Lymphs Abs: 1 10*3/uL (ref 0.7–4.0)
Monocytes Relative: 9 % (ref 3–12)

## 2010-12-04 LAB — BASIC METABOLIC PANEL
Chloride: 103 mEq/L (ref 96–112)
GFR calc Af Amer: 54 mL/min — ABNORMAL LOW (ref >60)
GFR calc non Af Amer: 45 mL/min — ABNORMAL LOW (ref >60)
Potassium: 3.7 mEq/L (ref 3.5–5.1)
Sodium: 136 mEq/L (ref 135–145)

## 2011-01-07 NOTE — Group Therapy Note (Signed)
[name], [name]             ACCOUNT NO.:  [id number]   MEDICAL RECORD NO.:  [id number]          PATIENT TYPE:  INP   LOCATION:  A340                          FACILITY:  [facility]   PHYSICIAN:  Edward L. Juanetta Gosling, M.D.DATE OF BIRTH:  [birth year]   DATE OF PROCEDURE:  DATE OF DISCHARGE:                                 PROGRESS NOTE   [name] is, I think, about the same.  He seems to be improving  some every day.  He is awake and alert.  He has no new complaints.   PHYSICAL EXAMINATION:  VITAL SIGNS:  His heart rates in the 80s, looks  mildly irregular.  Blood pressure in the 110-120 range systolic.  CHEST:  Much clearer.  HEART:  Regular.  ABDOMEN:  Soft.   LABORATORY WORK:  White count 7700, hemoglobin is 10.4, platelets 319.  BMET shows a BUN of 16, creatinine 1.34, glucose of 106 and hepatic  function profile shows that his alkaline phosphatase is 134, bilirubin  is 0.5, albumin is 1.7.   ASSESSMENT:  He is better.   PLAN:  I think it is probably okay to move him from the ICU from a  strictly pulmonary point of view.  Since he is so much better from a  respiratory standpoint, I am going to plan to follow more peripherally.       Edward L. Juanetta Gosling, M.D.  Electronically Signed     ELH/MEDQ  D:  03/27/2009  T:  03/27/2009  Job:  [id number]

## 2011-01-07 NOTE — Group Therapy Note (Signed)
[name], [name]             ACCOUNT NO.:  [id number]   MEDICAL RECORD NO.:  [id number]          PATIENT TYPE:  INP   LOCATION:  A339                          FACILITY:  [facility]   PHYSICIAN:  [name], MD   DATE OF BIRTH:  Jul 06, 1912   DATE OF PROCEDURE:  DATE OF DISCHARGE:                                 PROGRESS NOTE   This patient was admitted to the hospital with back pain.  X-rays done  in the emergency apartment showed compression fracture at T11, T12, L1,  L4 and T12 fracture appears to be acute with paraspinous hematoma in the  area.  The patient continues to have some pain in this area.   OBJECTIVE:  VITAL SIGNS:  Blood pressure 131/70, temperature 97.8, and  pulse 58  HEART:  Regular rhythm.  No murmurs.  LUNGS:  Clear to P&A.  ABDOMEN:  No palpable organs or masses.  MUSCULOSKELETAL:  The patient has paraspinous muscle tenderness in  thoracic lumbar spine.   ASSESSMENT:  The patient has fractures of the thoracic and lumbar  vertebrae most of which appear old, but the T12 fracture is more acute.  He does have urinary tract infection.   PLAN:  To obtain urine for culture and sensitivity.  Start Macrobid.  We  will fit with cast brace and start gradual ambulation.  Obtain  orthopedic consult.      [name], MD  Electronically Signed     AGM/MEDQ  D:  12/14/2008  T:  12/14/2008  Job:  [id number]

## 2011-01-07 NOTE — Group Therapy Note (Signed)
NAMEMALACHY, [name]             ACCOUNT NO.:  [id number]   MEDICAL RECORD NO.:  [id number]          PATIENT TYPE:  ORB   LOCATION:  S147                          FACILITY:  [facility]   PHYSICIAN:  Angus G. McInnis, MD   DATE OF BIRTH:  [birth year]   DATE OF PROCEDURE:  DATE OF DISCHARGE:                                 PROGRESS NOTE   This patient has a history of gram-negative sepsis and as well as  myocardial infarction and congestive heart failure, also has chronic  atrial fibrillation, history of hypertension, COPD.  He was on  ventilatory support for a period of time in the hospital, continues to  do fairly well, he is more alert.  He does continue to have some edema  in his extremities.   OBJECTIVE:  VITAL SIGNS:  Blood pressure 112/70, respirations 20, pulse  56, temperature 97.6.  LUNGS:  Diminished breath sounds.  HEART:  Regular rhythm.  ABDOMEN:  No palpable organs or masses.  EXTREMITIES:  The patient does continue to have bilateral edema in both  lower extremities.   ASSESSMENT:  The patient was treated for sepsis in the hospital setting.  He did have a myocardial infarction with small amount of necrosis, does  have a history of bilateral pleural effusions and consolidation of the  left base, did have initially a fracture of his right hip, doing better.  We will repeat his chemistries and get another x-ray of his chest,  increased gradually his diuretic to obtain optimum diuresis.      [name], MD  Electronically Signed     AGM/MEDQ  D:  04/03/2009  T:  04/04/2009  Job:  [id number]

## 2011-01-07 NOTE — Group Therapy Note (Signed)
[name], [name]             ACCOUNT NO.:  [id number]   MEDICAL RECORD NO.:  [id number]         PATIENT TYPE:  PINP   LOCATION:  A340                          FACILITY:  [facility]   PHYSICIAN:  Edward L. Juanetta Gosling, M.D.DATE OF BIRTH:  [birth year]   DATE OF PROCEDURE:  DATE OF DISCHARGE:                                 PROGRESS NOTE   Patient of Dr. Renard Matter.  [name] has moved from the ICU and seems  to be doing better.   His exam shows that his temperature is 97.1, pulse is 73, respirations  20, blood pressure 113/69, O2 sats 98% on 3 L, and his weight 99.70.  He  looks comfortable.   Assessment is that he is better.   PLAN:  I am going to plan to sign off at this point.   Thank you very much for allowing me to see him with you.      Edward L. Juanetta Gosling, M.D.  Electronically Signed     ELH/MEDQ  D:  03/28/2009  T:  03/28/2009  Job:  [id number]

## 2011-01-07 NOTE — Discharge Summary (Signed)
[name], [name]             ACCOUNT NO.:  [id number]   MEDICAL RECORD NO.:  [id number]          PATIENT TYPE:  INP   LOCATION:  A340                          FACILITY:  [facility]   PHYSICIAN:  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   The patient's condition stable at time of his transfer.   This 77 year old patient was admitted from the nursing center after  having spiked a fever of 103.  He had a prior history of having open  reduction of his right hip because of right intertrochanteric fracture.  He has had impaired sensorium and was volume depleted, and running high  fever at the time of his admission.  He was found to have a blood  pressure of 70.  The patient presenting with septic shock, abnormal  labs, and ER included elevated alkaline phosphatase and liver function  test.  White count of 3.9, hemoglobin 10.5, creatinine 1.69, diminished  albumin 2.0.  The patient has chronic atrial fibrillation, history of  hypertension.  He had little hematuria, was suspected of having sepsis,  did have hypotension while in the emergency room.  Family was summoned  and discussed high mortality rate and was empirically following cultures  placed on Zosyn and vancomycin; and admitted to ICU for septic shock  protocol.  The patient subsequently intubated due to hypoxia and  decreased work of breathing.   PHYSICAL EXAMINATION:  VITAL SIGNS:  On admission, temperature 103.3,  blood pressure 110/48 in the ED which dropped to 73/54, O2 sats 93%,  pulse 112 and irregular.  NEUROLOGIC:  The patient was responding to painful stimulus.  No focal  deficit.  HEENT:  Eyes, PERRLA.  TMs negative.  Oropharynx benign.  NECK:  Supple.  No JVD or thyroid abnormalities.  LUNGS:  Clear breath sounds bilaterally.  HEART:  Irregular rhythm with no murmurs.  ABDOMEN:  No palpable organs or masses.  No organomegaly.  RECTAL:  Heme-negative.  EXTREMITIES:  A 1+ to 2+ chronic pitting edema.   LABORATORY DATA:  On admission, CBC, WBC 3900 with hemoglobin 10.5,  hematocrit 29.8.  Urinalysis was essentially negative.  Amylase 50 and  lipase 21.  Arterial blood gases on admission, pH 7.470 with a PCO2 of  25, PO2 of 56.2.  Lactic acid 3.8, D-dimer greater than 20.  ABGs  subsequently on March 18, 2009, pH of 7.424 with PCO2 of 25.3, PO2 of  108.0.  Cardiac panel on March 18, 2009 showed a CPK of 428, CK-MB 9.4,  relative index 2.2, troponin 2.61.  D-dimer on March 18, 2009, over 20.  ABGs on March 18, 2009, pH of 7.402 with PCO2 of 26.3, PO2 of 142.  Hepatic panel on March 29, 2009, alkaline phosphatase 121, SGOT 27, SGPT  16.  BMET on March 30, 2009, sodium 131, potassium 4.3, chloride 98, CO2  of 29, glucose of 99, BUN 30, creatinine 1.18.  Hepatic panel on March 30, 2009, SGOT 32, SGPT 18.   Chest x-ray on March 18, 2009, cardiac enlargement, COPD, no active  pulmonary disease.  Chest x-ray on March 18, 2009, cardiomegaly.  Subsequent chest x-ray on March 26, 2009, improved bilateral air space  opacity and pleural effusions.   HOSPITAL COURSE:  The patient at time of his admission was intubated,  was placed on Lovenox 30 mg every 24 hours and vancomycin 1 g every 24  hours, was maintained on this pharmaceutical protocol.  Vancomycin  dosage was increased.  He was also given Protonix 40 mg every 24 hours.  Dr. Juanetta Gosling was consulted for management of the ventilator.  [place]  [facility] was consulted in regards to elevated cardiac markers.  It was  felt that the patient had atrial fibrillation, lateral wall ischemia,  and probable myocardial infarction.  He was on pressor agents in order  to maintain his blood pressure.  It was felt respiratory failure, was  hyperkalemic, this was adjusted and repleted.   The patient maintained on  D5 half-normal saline 125 mL/hour.  Because of edema, he was given Lasix  intravenously q.12 h.  The patient also was given Rocephin 1 g every 24  hours.  His vancomycin was discontinued after March 21, 2009.  He was  able to be extubated, March 22, 2009.  He remained in ICU until March 27, 2009, and was moved to the third floor.  The patient continued to have  edema in his extremities and scrotal area, was diuresed with Lasix,  maintained on Lasix, potassium supplement.  He was started on [employer]  [employer]ze t.i.d.  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  The patient finally was able to be discharged to [facility]  on March 30, 2009.   Discharged on the following medications:  1. Aspirin 81 mg daily.  2. Protonix 40 mg daily.  3. Potassium chloride 20 mEq t.i.d.  4. Lasix 40 mg b.i.d.  5. Zovirax 5% ointment, apply to lips 5 times daily.  6. Vicodin 5/500 every 4 hours p.r.n. for pain.   The patient stable at time of his transfer to [facility].      [name], MD  Electronically Signed     AGM/MEDQ  D:  03/30/2009  T:  03/30/2009  Job:  [id number]

## 2011-01-07 NOTE — Discharge Summary (Signed)
[name], [name]             ACCOUNT NO.:  [id number]   MEDICAL RECORD NO.:  [id number]          PATIENT TYPE:  INP   LOCATION:  2008                         FACILITY:  [facility]   PHYSICIAN:  Rollene Rotunda, MD, FACCDATE OF BIRTH:  1911/11/15   DATE OF ADMISSION:  05/24/2007  DATE OF DISCHARGE:  05/28/2007                               DISCHARGE SUMMARY   DISCHARGE DIAGNOSES:  1. Scrotal swelling secondary to left epididymitis requiring      antibiotics.  2. Chronic atrial fibrillation with controlled ventricular.  3. Known three-vessel coronary artery disease with aortic stenosis,      appears to be stable.  4. History as previously.   SUMMARY OF HISTORY:  [name] is a 77 year old male who fell at home  last week due to a missed step.  In the emergency room he was found to  have an incarcerated left inguinal hernia, which was reduced by the  surgeon was some difficulty.  It was felt that a surgical repair would  be lower with an interval between an acute incarceration and surgery.  However, he returns with recurring swelling in the left inguinal region  and it was felt that he needed cardiac clearance prior to surgery.   PAST MEDICAL HISTORY:  1. Atrial fibrillation.  2. Aortic valve disease.  3. Known three-vessel coronary artery disease with a catheterization      in 2002, at which time he refused cardiac surgery.   LABORATORY:  Admission H&H at [facility] was 13.8 and 41.5, normal  indices, platelets 204, WBC 6.6.  on the 2nd H&H was 13.0 and 38.7,  normal indices, platelets 214, WBC 6.3.  PTT 29, PT 14.7.  Sodium 139,  potassium 3.9, BUN 18, creatinine 1.57, normal LFTs.  TSH 2.522.  On the  sodium was 137, potassium 3.9, BUN 26, creatinine 1.43.  EKG on  admission showed atrial fibrillation with a variable ventricular rate  but controlled, left axis deviation, left anterior hemiblock, delayed R  wave, nonspecific ST-T wave changes.  Chest x-ray on admission showed  cardiac enlargement, prominent interstitial markings which was felt to  be secondary to chronic lung disease and/or interstitial edema.  Ultrasound of the scrotum was performed, which showed left-sided  epididymitis and moderate left hydrocele, two small hypervascular areas  were noted to be in the epididymal tail, suspicious for phlegmon or  early abscess.  No evidence of testicular mass or torsion.  Small right  varicocele, diffuse scrotal wall thickening.   HOSPITAL COURSE:  The patient was admitted to [facility] for further  treatment.  Urology consult was called with Dr. Earlene [name].  Dr. Graciela [name] on the  30th described his cardiac issues as stable.  Antibiotics were begun and  ultrasound performed.  Throughout his admission cardiac remained stable  and urology continued to treat.  On the 2nd urology felt that he had  improved significantly, however noted that it would take several weeks  for him to normalize.  It was felt that he could be discharged home on  p.o. antibiotics and would follow him up in the office within 2 weeks.  On the 3rd third Dr. Antoine [name] reviewed and agreed that the patient was  discharged home.   DISPOSITION:  The patient is discharged home with instructions per  urology.  He was asked to call Dr. Earlene [name] to arrange a 2-week follow-up  appointment.  They also gave him a prescription for Cipro 500 mg b.i.d.  for 14 days.  He will follow up with Dr. Dietrich [name] in the [place]  office on June 14, 2007, at 2:45 p.m.   MEDICATIONS AT THE TIME OF DISCHARGE:  1. Aspirin 81 mg daily.  2. Lopressor 12.5 mg b.i.d.  3. Lasix 20 mg daily.  4. Nitroglycerin 0.4 mg as needed.   DISCHARGE TIME:  25 minutes.      Richard Rued, PA-C      Rollene Rotunda, MD, [facility]  Electronically Signed    EW/MEDQ  D:  05/28/2007  T:  05/28/2007  Job:  [id number]   cc:   [name], M.D.  Richard G. Renard Matter, MD  Richard Friends. Richard Pates, MD, [facility]

## 2011-01-07 NOTE — Discharge Summary (Signed)
[name], [name]             ACCOUNT NO.:  [id number]   MEDICAL RECORD NO.:  [id number]          PATIENT TYPE:  INP   LOCATION:  5003                         FACILITY:  [facility]   PHYSICIAN:  Marlowe Kays, M.D.  DATE OF BIRTH:  17-May-1912   DATE OF ADMISSION:  03/11/2009  DATE OF DISCHARGE:  03/14/2009                               DISCHARGE SUMMARY   PREOPERATIVE DIAGNOSIS:  Displaced intertrochanteric fracture, right  hip.   ADMISSION DIAGNOSES:  1. Displaced intertrochanteric fracture, right hip.  2. Atrial fibrillation.  3. Hypertension.  4. Hypercholesterolemia.   DISCHARGE DIAGNOSES:  1. Displaced intertrochanteric fracture, right hip.  2. Atrial fibrillation.  3. Hypertension.  4. Hypercholesterolemia.   OPERATION:  Closed reduction and internal fixation with hip compression  screw, intertrochanteric fracture right hip on March 11, 2009.   SUMMARY:  This 77 year old male was preparing his dinner on the day  prior to admission when he fell injuring his right hip.  He was brought  to the [facility] Emergency Room on March 11, 2009.  X-rays demonstrated  displaced intertrochanteric fracture of his right hip.  EKG demonstrated  atrial fibrillation.  We have him seen in consultation by [facility]  Cardiology who okayed him for surgery, but they emphasized that there  was a certain amount of risk involved with the surgery.  It was  recommended by the consulting physician Dr. Rollene Rotunda that  postoperatively he be continued on low-dose aspirin rather than Coumadin  because of fear of falls.   The surgery went on eventfully.  Postoperatively, he had a slight drop  in his hemoglobin, which over last 2 days was 9.0 and 8.3.  He was  having no symptoms whatsoever of anemia.  His urine output had been  slightly diminished, but had picked up conservatively with supplementary  IV fluids.  Plan is to have him transferred to a nursing facility today  with physical therapy  initially bed-to-chair, but gait training with  touchdown weight bearing only in his right leg.  We will keep his wound  dressed as long as there is any drainage.  Fluids will be encouraged.  Plan to keep the Foley catheter in until his urinary output is good.   Plan will be to discharge him on his admission medications of;  1. Zocor 20 mg daily.  2. Lasix 20 mg b.i.d.  3. Lisinopril 5 mg daily.  4. Aspirin 81 mg daily.  5. Also, we will place him on some Vicodin for pain.   Plan is to have him rechecked in my office roughly 2 weeks since  surgery, which will be early in the week of March 26, 2009.   CONDITION ON DISCHARGE:  Stable and improved.           ______________________________  Marlowe Kays, M.D.     JA/MEDQ  D:  03/14/2009  T:  03/15/2009  Job:  [id number]   cc:   Rollene Rotunda, MD, [facility]

## 2011-01-07 NOTE — Group Therapy Note (Signed)
NAMEFERGUSON, GERTNER             ACCOUNT NO.:  [id number]   MEDICAL RECORD NO.:  [id number]          PATIENT TYPE:  INP   LOCATION:  IC04                          FACILITY:  [facility]   PHYSICIAN:  [name], MD   DATE OF BIRTH:  Aug 03, 1912   DATE OF PROCEDURE:  DATE OF DISCHARGE:                                 PROGRESS NOTE   This patient remains on the ventilator support.  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  He also has a history of chronic atrial  fibrillation, hyperlipidemia, and abnormal cardiac markers suggestive of  coronary ischemia.  The patient has been seen in consultation by  Cardiology, Dr. Dietrich Pates, who suggested taper of his pressors as long as  his blood pressure permits and as long as he is hemodynamically stable.  The patient was noted yesterday to have evidence of edema in his  extremity and scrotum.  His IV fluids were decreased, and he was placed  on Lasix and has been diuresing.  Last he put out was 3100 mL of urine.  A repeat chest x-ray yesterday showed evidence of bilateral pleural  effusions and lower lobe collapse.   OBJECTIVE:  VITAL SIGNS:  Blood pressure 99/55, pulse rate 56.  HEENT:  Eyes are PERRLA.  TMs negative.  Oropharynx benign.  NECK:  Supple.  LUNGS:  Clear to P&A.  HEART:  Regular rhythm, 2/6 systolic ejection murmur.  ABDOMEN:  No palpable organs or masses.  EXTREMITIES:  Pitting edema bilaterally and some edema in the scrotal  area.   ASSESSMENT:  The patient remains on ventilator support.  He is  diuresing.  He in all likelihood has had some coronary event with  myocardial ischemia.  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  A 2D echo showed ejection fraction of 55-60%.   Plan to continue current regimen.  We will obtain Pulmonary consult to  help with ventilatory support and for further assessment.  We will check  with  Pharmacy concerning the appropriateness of the current antibiotic  regimen.      [name], MD  Electronically Signed     AGM/MEDQ  D:  03/21/2009  T:  03/21/2009  Job:  [id number]

## 2011-01-07 NOTE — H&P (Signed)
NAMEBYRNE, CAPEK             ACCOUNT NO.:  [id number]   MEDICAL RECORD NO.:  [id number]          PATIENT TYPE:  EMS   LOCATION:  ED                           FACILITY:  [facility]   PHYSICIAN:  Ollen Gross. Vernell Morgans, M.D. DATE OF BIRTH:  1912/04/01   DATE OF ADMISSION:  05/20/2007  DATE OF DISCHARGE:                              HISTORY & PHYSICAL   The patient is a 77 year old white male who has had a left inguinal  hernia for about 70 years.  He has never wanted to have it fixed.  He  fell on Monday and developed a larger bulge in the left groin that has  been painful for him.  He denies any fevers, no nausea and vomiting, he  has had difficulty having a bowel movement for the last couple of days.  He went to his medical doctor who was unable to reduce it and he went to  his medical doctor who was unable to reduce it and then was sent to the  emergency department for further evaluation.  The rest of his review of  systems are unremarkable.   PAST MEDICAL HISTORY:  Significant for coronary artery disease,  myocardial infarction 5-6 years ago, and pneumonia.   PAST SURGICAL HISTORY:  Significant for a tonsillectomy.   MEDICATIONS:  Aspirin and Lasix.   ALLERGIES:  No known drug allergies.   SOCIAL HISTORY:  He denies the use of alcohol or tobacco products.   FAMILY HISTORY:  Noncontributory.   PHYSICAL EXAMINATION:  VITAL SIGNS:  Temperature 97.7, blood pressure  167/105, pulse 87.  GENERAL:  He is an elderly white male in no acute distress.  SKIN:  Warm and dry with no jaundice.  HEENT:  Eyes; extraocular muscles intact.  Pupils equal, round, and  reactive to light.  Sclerae anicteric.  LUNGS:  Clear bilaterally with no use of accessory respiratory muscles.  HEART:  Regular rate and rhythm with impulse in the left chest.  ABDOMEN:  Soft and nontender.  No palpable mass or hepatosplenomegaly.  EXTREMITIES:  No cyanosis, clubbing, or edema with good strength in his  arms and  legs.  NEUROLOGY:  He is alert and oriented x3 with no stage of anxiety or  depression.  GENITOURINARY:  He has normal appearing external genitalia.  He has a  large bulge in the left groin that goes down into his scrotum.  After  some manipulation, this was able to be reduced and his pain resolved.   ASSESSMENT:  This is a 77 year old white male with significant coronary  artery disease who had an incarcerated left inguinal hernia.  This was  able to be reduced in the emergency department and his pain has pretty  resolved at this point.  We will plan to watch him overnight to make  sure that his abdominal pain does not get worse.  If it does,  he will need exploration for possible compromise of his colon.  If he  has no abdominal pain by tomorrow, then we will plan to discharge him in  the morning with close follow-up to his cardiologist.  He  will need  cardiac clearance prior to scheduling an elective hernia repair.      Ollen Gross. Vernell Morgans, M.D.  Electronically Signed     PST/MEDQ  D:  05/20/2007  T:  05/21/2007  Job:  [id number]

## 2011-01-07 NOTE — Group Therapy Note (Signed)
NAMEARIQ, KHAMIS             ACCOUNT NO.:  [id number]   MEDICAL RECORD NO.:  [id number]          PATIENT TYPE:  INP   LOCATION:  IC04                          FACILITY:  [facility]   PHYSICIAN:  Edward L. Juanetta Gosling, M.D.DATE OF BIRTH:  04-27-1912   DATE OF PROCEDURE:  DATE OF DISCHARGE:                                 PROGRESS NOTE   Mr. Leonhard was able to be extubated yesterday and thus far has done  well.  He has had no new complaints.  He has blood cultures and urine  culture positive for Morganella.  He has had a recent hip fracture and  he has not been up and moving around.  He has coronary artery disease  and may have had some myocardial damage, has history of COPD, atrial  fibrillation.  He was able to be extubated yesterday and has so far  tolerated it well.  He says he wants to get up, but apparently he has  not been up at all since he has had his surgery done.   PHYSICAL EXAMINATION:  GENERAL:  He is awake and alert.  VITAL SIGNS:  His blood pressure is running in the 115 range, pulse in  the 70s.  CHEST:  Relatively clear with decreased breath sounds.  HEART:  Irregular.  ABDOMEN:  Soft.   ASSESSMENT:  I think he is better.   PLAN:  I think we can advance his diet.  I am going to go and ask for  Physical Therapy to see him.  I am going to have him do incentive  spirometry and continue with all of his other treatments.       Edward L. Juanetta Gosling, M.D.  Electronically Signed     ELH/MEDQ  D:  03/23/2009  T:  03/23/2009  Job:  [id number]

## 2011-01-07 NOTE — H&P (Signed)
[name], [name]             ACCOUNT NO.:  [id number]   MEDICAL RECORD NO.:  [id number]          PATIENT TYPE:  INP   LOCATION:  5003                         FACILITY:  [facility]   PHYSICIAN:  [name], P.A.DATE OF BIRTH:  [birth year]   DATE OF ADMISSION:  03/11/2009  DATE OF DISCHARGE:                              HISTORY & PHYSICAL   CHIEF COMPLAINT:  Pain in my right hip.   PRESENT ILLNESS:  This 77 year old white male who lives alone [place]  farm was preparing his dinner last evening around 6 o'clock when he got  up to re-heat something that was part of his dinner and fell.  He had  pain into his right hip.  He really could not ambulate.  This was  unwitnessed and he had no other injuries.  Today this morning, his son  went by to see him and saw that he had made himself fairly comfortable  with some sofa cushions etc.  He was noted to ambulance with summon and  brought to the emergency room where x-rays revealed an intertrochanteric  fracture with varus deformity of the right hip.  X-rays, CT scan of the  head were done, primarily main findings was the intertrochanteric  fracture of the right hip.   PAST MEDICAL HISTORY:  This gentleman according to his son has been  relatively in good health.  He has had a history of an MI some 10 years  ago as well as a vertebroplasty.   Family history is noncontributory.   SOCIAL HISTORY:  The patient is retired, extremely independent, and self-  sufficient.  No intimate take of tobacco or alcohol products.   ALLERGIES:  No medical allergies.   CURRENT MEDICATION:  1. Aspirin 81 mg daily.  2. Simvastatin 20 mg daily.  3. Furosemide 20 mg 2 daily.  4. Lisinopril 5 mg daily.   Normally, Dr. Dietrich [name] of [facility] is his regular internist.   REVIEW OF SYSTEMS:  CNS:  No seizures or paralysis, numbness, double  vision.  RESPIRATORY:  No productive cough, no hemoptysis, no shortness  of breath.  CARDIOVASCULAR:  No chest  pains, no angina, no orthopnea.  GASTROINTESTINAL:  No nausea, no vomiting, no bloody stool.  GENITOURINARY:  No discharge, no dysuria, no hematuria.  MUSCULOSKELETAL:  Primarily in present illness.  GENERAL:  Little hard-  of-hearing.   PHYSICAL EXAMINATION:  GENERAL:  This is an alert and oriented 89-year-  old white male seen lying on emergency room stretcher and is accompanied  by his son, [name].  HEENT:  Normocephalic.  PERRLA.  Oropharynx is clear.  CHEST:  Clear to auscultation.  No rhonchi, no rales, no wheezes.  HEART:  With slightly irregular rate and rhythm.  Heart sounds are  distant.  ABDOMEN:  Soft.  Bowel sounds present.  GENITALIA:  Rectal not done apparently in present illness.  EXTREMITIES:  The patient has a slight shortening in external rotation  with good pulses and sensory intact to the right lower extremity.   EKG has shown atrial fibrillation.   ADMITTING DIAGNOSES:  1. Intertrochanteric fracture of the  right hip.  2. Hypercholesterolemia.  3. Hypertension.  4. Atrial fibrillation per emergency room EKG.   PLAN:  The patient will undergo open reduction and internal fixation,  right hip with compression screw and side plate when operating room is  available.  He has been n.p.o. since last evening.  We have asked Dr.  Antoine [name] to consult Cardiology on this patient, and we would appreciate  this clearance prior to taking him on surgery.  Once all of this is  done, we can perform this surgery as mentioned above.      [name].     DLU/MEDQ  D:  03/11/2009  T:  03/12/2009  Job:  [id number]   cc:   [name], M.D.  [name]. Richard Pates, MD, [facility]

## 2011-01-07 NOTE — Op Note (Signed)
[name], [name]             ACCOUNT NO.:  [id number]   MEDICAL RECORD NO.:  [id number]          PATIENT TYPE:  INP   LOCATION:  5003                         FACILITY:  [facility]   PHYSICIAN:  Marlowe Kays, M.D.  DATE OF BIRTH:  06-Mar-1912   DATE OF PROCEDURE:  03/11/2009  DATE OF DISCHARGE:                               OPERATIVE REPORT   PREOPERATIVE DIAGNOSIS:  Closed displaced intertrochanteric fracture  right hip.   POSTOPERATIVE DIAGNOSIS:  Closed displaced intertrochanteric fracture  right hip.   OPERATION:  Closed reduction and internal fixation with hip compression  screw intertrochanteric fracture right hip.   SURGEON:  Marlowe Kays, MD   ASSISTANT:  Druscilla Brownie. Idolina Primer, PA-C   ANESTHESIA:  General.   INDICATIONS FOR PROCEDURE:  Stay in diagnosis, he was cleared for  surgery by Cardiology, who recommended postoperatively that we not put  him on Coumadin because of fear of falling.  Preoperatively, he was on  aspirin 81 mg daily and we will maintain him on it as well as TED hose.   PROCEDURE:  Prophylactic antibiotics, satisfied general anesthesia,  placed in the [facility] fracture table and with traction and internal  rotation, we were able to obtain essentially anatomic reduction of the  fracture.  Then time-out was performed.  We prepped the lateral hip with  DuraPrep.  Shower curtain employed and using the C-arm I was able to  mark out the proximal extent of the incision just slightly above  proximal to the base of the greater trochanter.  Incision was carried  down through the fascia lata.  Vastus lateralis was opened posteriorly  and elevated and after placing a guidepin on top of the femur using the  C-arm, I placed a small drill hole in the lateral femur and placed my  initial guidepin using 135 degree angle guide right down the center of  the neck and head on the lateral projection and down the center of the  neck but little higher than I would like  on the AP projection.  Accordingly, I placed a second guidepin parallel and slightly inferior  to this and this was then found to be in satisfied position on AP and  lateral projections.  I measured the guidepin 105 mm and selected a 95-  mm lag screw.  I then advanced the guidepin into the acetabulum for  better fixation and overdrilled with two 95 mm.  I then used the small  threaded lag screw over the guidepin followed by the plate which I  impacted into the drill hole and then temporary fixed it to the femur in  satisfied position with a turkey-claw clamp.  I then individually  drilled, measured and screwed the four holes.  Final pictures were taken  in AP and lateral projections and found everything looked good.  The  wound was irrigated with sterile saline and closure performed with  running #1 Vicryl in the vastus lateralis, interrupted #1 Vicryl in the  fascia lata, interrupted #1 Vicryl deep and 2-0 Vicryl subcutaneous  tissue and staples in the skin.  Dry sterile dressing applied.  He was  then taken off the [facility] fracture table and carefully placed in his  PACU bed.  Estimated blood loss was perhaps 200 mL, no blood replacement  and no known operative complications.           ______________________________  Marlowe Kays, M.D.     JA/MEDQ  D:  03/11/2009  T:  03/12/2009  Job:  [id number]

## 2011-01-07 NOTE — H&P (Signed)
[name], [name]             ACCOUNT NO.:  [id number]   MEDICAL RECORD NO.:  [id number]          PATIENT TYPE:  INP   LOCATION:  NA                           FACILITY:  [facility]   PHYSICIAN:  Gerrit Friends. Dietrich Pates, MD, FACCDATE OF BIRTH:  1912/05/06   DATE OF ADMISSION:  05/24/2007  DATE OF DISCHARGE:                              HISTORY & PHYSICAL   REFERRING PHYSICIAN:  Angus G. McInnis, MD   HISTORY OF PRESENT ILLNESS:  77 year old gentleman with coronary artery  disease, atrial fibrillation and aortic valve disease now admitted for  recurrent incarcerated left inguinal hernia.  Mr. Moone underwent  cardiac catheterization in 2002, revealing fairly severe three-vessel  coronary disease.  He refused surgery at that time and has done well  since with essentially no cardiopulmonary symptoms.  He has also been  found to have aortic stenosis, which was mild when last assessed by  echocardiography 5 months ago.  He fell at home last week, clearly due  to a missed step rather than an episode of loss of consciousness.  He  was found in the emergency department to have an incarcerated left  inguinal hernia which was reduced by the surgeon with some difficulty.  It was felt that his risk for surgical repair would be lower with an  interval between an acute incarceration and surgery, but he now returns  with recurrent swelling in the left inguinal region to an appointment  that was made for surgical clearance.   Mr. Hinchman is relatively inactive, but denies any dyspnea nor chest  pain.  He was previously treated with metoprolol, but this was  apparently stopped due to bradycardia.  As a result of his age and  unsteadiness, he has not been thought to be a good candidate for  warfarin.  His only medications are furosemide and aspirin.  We have  tried to treat him with simvastatin in light of his coronary disease,  but he suffered an adverse effect, the nature of which he does not  remember.   PAST MEDICAL HISTORY:  Otherwise, generally unremarkable.   PAST SURGICAL HISTORY:  His only his surgery has been a tonsillectomy.   FAMILY HISTORY:  No early onset of coronary disease.  His father lived  into his 42s, but did have some sort of heart rhythm problem.   SOCIAL HISTORY:  The patient has support of family nearby.  He is a  retired Visual merchandiser.  No history of excessive alcohol use or significant  tobacco use.   REVIEW OF SYSTEMS:  Notable for occasional constipation.  He notes  occasional lightheadedness, but has had no syncope.  He requires  corrective lenses for near and far vision.  He has some mild gait  instability.  All other systems reviewed and are negative.   PHYSICAL EXAMINATION:  GENERAL:  A pleasant gentleman with slightly  slowed mentation, but good comprehension and communication skills.  VITAL SIGNS:  The weight is 191, 8 pounds less than last year, blood  pressure 135/85, heart rate 73 and irregular, respirations 16.  Afebrile.  HEENT:  EOMs full.  Normal lids and  conjunctiva.  Normal oral mucosa.  NECK:  No jugular venous distention.  Normal carotid upstrokes without  bruits.  ENDOCRINE:  No thyromegaly.  HEMATOPOIETIC:  No adenopathy.  SKIN:  No significant lesions.  LUNGS:  Clear.  CARDIAC:  Irregular rhythm.  Grade 2/6 basilar systolic ejection murmur.  Decreased, but present aortic component of the second heart sound.  PMI  not palpable.  ABDOMEN:  Soft and nontender.  Normal bowel sounds.  No organomegaly.  EXTREMITIES:  Trace edema.  Distal pulses intact.  NEUROMUSCULAR:  Symmetric strength and tone.  Normal cranial nerves.  GENITALIA:  Tense swelling of the left scrotum with marked tenderness to  palpation.   LABORATORY DATA AND X-RAY FINDINGS:  EKG shows atrial fibrillation with  controlled ventricular response; left anterior fascicular block; delayed  R-wave progression; nonspecific ST and T-wave abnormality.  No change  when  compared to a prior tracing of July 24, 2004.   Other laboratory is pending.   IMPRESSION/PLAN:  Mr. Boomhower case was discussed with Dr. Carolynne Edouard.  We  will proceed to admit him to the cardiology service with consultation by  General Surgery.  They will attempt to again reduce this hernia, if  appropriate.  Since he will probably require surgery, beta-blocker will  be added at a low-dose in an attempt to reduce risk, which is  significant in light of his age and medical problems.  He remains a poor  candidate for chronic anticoagulation.  He will be monitored to verify  that metoprolol does not result in excessive slowing of his heart rate.      Gerrit Friends. Dietrich Pates, MD, [facility]  Electronically Signed     RMR/MEDQ  D:  05/24/2007  T:  05/24/2007  Job:  [id number]   cc:   Ollen Gross. Vernell Morgans, M.D.

## 2011-01-07 NOTE — Group Therapy Note (Signed)
[name], MCCAULEY             ACCOUNT NO.:  [id number]   MEDICAL RECORD NO.:  [id number]          PATIENT TYPE:  INP   LOCATION:  A340                          FACILITY:  [facility]   PHYSICIAN:  [name], MD   DATE OF BIRTH:  [birth year]   DATE OF PROCEDURE:  03/29/2009  DATE OF DISCHARGE:                                 PROGRESS NOTE   SUBJECTIVE:  This patient remains fairly stable.  He was admitted  following hip fracture, gram-negative sepsis, subsequent myocardial  infarction, congestive heart failure, and pneumonia.  Overall condition  continues to show improvement.   PHYSICAL EXAMINATION:  VITAL SIGNS:  Blood pressure 121/68, respirations  20, pulse 88, temperature 97.2, O2 sats ranged from 96-98.  GENERAL:  The patient continues to diurese, but now has positive fluid  balance.  LUNGS:  Diminished breath sounds bilaterally.  HEART:  Regular rhythm.  ABDOMEN:  No palpable organs or masses.  EXTREMITIES:  The patient has minimal edema in his extremities.   ASSESSMENT:  The patient was admitted with above-stated problems.   PLAN:  To continue working with physical therapist.  Plans have been  made for his transfer to nursing facility when bed is available.      [name], MD  Electronically Signed     AGM/MEDQ  D:  03/29/2009  T:  03/29/2009  Job:  [id number]

## 2011-01-07 NOTE — H&P (Signed)
NAMESADAT, SLIWA NO.:  [id number]   MEDICAL RECORD NO.:  [id number]          PATIENT TYPE:  EMS   LOCATION:  ED                            FACILITY:  [facility]   PHYSICIAN:  Melvyn Novas, MDDATE OF BIRTH:  12/01/1911   DATE OF ADMISSION:  03/18/2009  DATE OF DISCHARGE:  LH                              HISTORY & PHYSICAL   The patient is a 77 year old white male patient of Dr. Felecia Shelling at the  nursing center admitted 2 days ago.  He is 1 week postop for right  intertrochanteric open reduction of his hip.  The patient was apparently  very alert and jovial one day ago.  He had diminished appetite last  night.  This morning he had diminished sensorium to the point of  obtundation.  Spiked a temperature of 103.5 and appeared volume  depleted.  The patient was subsequently transferred to the ER where he  was found to have a systolic blood pressure of 70 and the clinical  picture presenting is one of septic shock.  Abnormal labs in the ER  included elevated alkaline phosphatase and liver function tests, with a  white count of 3.9, hemoglobin of 10.5, creatinine of 1.69 and  diminished albumin of 2.0.  The patient has chronic atrial fibrillation  and history of hypertension.  Stat sonogram was done in the ER,  suspecting occult cholecystitis.  There was no stigmata on sonogram of  dilated gallbladder or ducts or stones within the ducts.  He had a  little hematuria yesterday due a Foley, and suspicion was given to  urosepsis.  While in the ER, he continued to have hypotension.  The  family was summoned.  Discussed high mortality rate and was empirically  placed after cultures of blood and urine on Zosyn and vancomycin and  admitted to the ICU with septic shock protocol.  The patient was  subsequently intubated due to hypoxia and decreased work of breathing.   PAST MEDICAL HISTORY:  Significant for chronic atrial fibrillation,  right bundle branch block pattern,  left anterior fascicular block,  hypertension, hyperlipidemia, COPD and anemia with hemoglobin of 10.8.   PAST SURGICAL HISTORY:  Notable for closed reduction of right  intertrochanteric hip fracture.   ALLERGIES:  HE HAS QUESTIONABLE ALLERGIES TO MORPHINE WHICH CAUSED  MENTAL STATUS CHANGES.   CURRENT MEDICATIONS:  1. Aspirin 81 mg per day.  2. Lasix 20 mg per day.  3. Lisinopril 5 mg per day.  4. Zocor 20 mg per day.  5. Vicodin 5/500 p.o. q.i.d. p.r.n.   Do not know if he was on anticoagulation.   PHYSICAL EXAMINATION:  Temperature was 103.3, blood pressure initially  110/48 in the ER and now dropped to 73/54, O2 sat was 93% on room air,  pulse was 112 and irregular.  The patient was obtunded, responded to painful stimuli.  He had a facial  grimace with lower abdominal palpation.  HEENT:  Eyes:  PERRLA, extraocular movements intact.  Sclerae clear.  Conjunctiva pink.  Throat shows dry, parched mucosa.  Tongue dry.  NECK:  No JVD, no carotid  bruits, no thyromegaly or thyroid bruits.  LUNGS:  Good breath sounds in all bases, all fields.  No rales, wheezes  or rhonchi appreciable.  HEART:  Irregularly irregular.  No S3.  No murmurs, gallops, heaves,  thrills or rubs.  ABDOMEN:  Soft.  Bowel sounds normoactive.  No peristaltic rushes.  He  has questionable guarding in bilateral lower quadrants.  No detectable  organomegaly.  No right upper quadrant tenderness.  RECTAL:  Heme-negative in the ER.  EXTREMITIES:  1+ to 2+ chronic pitting edema.  No palpable cord and  Homans' sign is negative.  NEUROLOGIC:  Cranial nerves II-XII grossly intact.  The patient is  somewhat obtunded.  Withdraws to pain.  Plantars downgoing.   IMPRESSION:  1. Septic shock, etiology is yet undetermined.  Suspect urosepsis,      possible perforated viscus or possible aspiration, as he vomited      last night.  2. Hypotension, shock.  3. Hypoxia, worsening precipitously.  4. Chronic atrial  fibrillation.  5. Status post surgical reduction of right intertrochanteric hip      fracture 1 week ago.  6. Hyperlipidemia.  7. History of hypertension.  8. Elevated liver function tests including alkaline phosphatase.   PLAN:  Admit to ICU.  Get abdominal ultrasound stat, urine and blood  cultures.  Empirically start vancomycin per protocol and Zosyn 3.375 IV  q.8 hours.  Intubate due to decreased work of breathing and maintain  oxygenation.  Monitor renal function serially, CBC serially and liver  function tests serially.  Start Levophed per septic shock protocol, and  I will make further recommendations as the database expands.   DATE OF BIRTH:      Melvyn Novas, MD  Electronically Signed     RMD/MEDQ  D:  03/18/2009  T:  03/18/2009  Job:  [id number]

## 2011-01-07 NOTE — Group Therapy Note (Signed)
[name], [name]             ACCOUNT NO.:  [id number]   MEDICAL RECORD NO.:  [id number]          PATIENT TYPE:  INP   LOCATION:  IC04                          FACILITY:  [facility]   PHYSICIAN:  Edward L. Juanetta Gosling, M.D.DATE OF BIRTH:  04-19-1912   DATE OF PROCEDURE:  DATE OF DISCHARGE:                                 PROGRESS NOTE   SUBJECTIVE:  Patient of Dr. Renard Matter.  [name] is continued to do  pretty well.  He worked some with physical therapy yesterday, but it was  quite limited.  He has no new complaints and says that he has eaten a  little bit, feels okay, and has no request.   OBJECTIVE:  His exam this morning shows that he is awake and alert.  He  still has some area where he has got abrasion on his lip.  His chest  shows rhonchi bilaterally.  His heart is irregular with a rate of about  80.  His blood pressures in the 130 systolic.  His abdomen is soft  without masses.  Bowel sounds are present and active.  Extremities  showed minimal edema.   His laboratory work today shows his BNP is 438.  Hepatic function  profile shows albumin of 1.6 and alkaline phos 155.  His BMET shows his  potassium is 3.4, BUN 27, and creatinine 1.35.  His CBC shows white  count 7300, hemoglobin 9.9, and platelets 185.   ASSESSMENT:  Overall, I think he is better.   PLAN:  I am going to plan to go ahead and continue with his treatments  try to get him continuing PT.  He is on antibiotics for his urinary  tract infection and see if can replace his potassium.      Edward L. Juanetta Gosling, M.D.  Electronically Signed     ELH/MEDQ  D:  03/24/2009  T:  03/24/2009  Job:  [id number]

## 2011-01-07 NOTE — Discharge Summary (Signed)
NAMEKARL, ERWAY             ACCOUNT NO.:  [id number]   MEDICAL RECORD NO.:  [id number]         PATIENT TYPE:  PINP   LOCATION:  A340                          FACILITY:  [facility]   PHYSICIAN:  [name], MD   DATE OF BIRTH:  11/16/1911   DATE OF ADMISSION:  DATE OF DISCHARGE:  LH                               DISCHARGE SUMMARY   ADDENDUM   MEDICATIONS:  1. Colace 100 mg b.i.d.  2. Zocor 20 mg daily.  3. Lisinopril 5 mg daily.  4. Aspirin 81 mg daily.      [name], MD  Electronically Signed     AGM/MEDQ  D:  03/30/2009  T:  03/30/2009  Job:  [id number]

## 2011-01-07 NOTE — Letter (Signed)
November 16, 2008    [name], MD  [address]  [place], [place] [address]   RE:  [name], [name]  MRN:  [id number]  /  DOB:  03/27/1912   Dear Thalia Party:   Mr. Bisping returns to the office for continued assessment and  treatment of cardiovascular disease, coronary risk factors, and sick  sinus syndrome.  Since his last visit, he reports being asymptomatic.  He carried a Holter monitor for 24 hours.  He had atrial fibrillation  with frequent PVCs and some impaired PVCs.  The lowest heart rate  recorded was 39, which occurred at night, probably when he was asleep.  He had no symptomatic bradycardia.   His blood pressure was not monitored at home.  He occasionally gets it  checked at his local pharmacy, but does not know the values that are  obtained.   CURRENT MEDICATIONS:  1. Furosemide 20 mg daily.  2. Aspirin 81 mg daily.  3. Simvastatin 40 mg daily.   PHYSICAL EXAMINATION:  GENERAL:  Elderly gentleman who is extremely hard  of hearing and in no acute distress.  NECK:  No jugular venous distention; no carotid bruits.  LUNGS:  Clear.  CARDIAC:  Irregular rhythm; normal first and second heart sounds; modest  systolic ejection murmur.  ABDOMEN:  Soft and nontender; no organomegaly.  EXTREMITIES:  Ankle edema 1+.   IMPRESSION:  Mr. Scarfo is doing generally well with his current  modest medical therapy.  His recent lipid profile was suboptimal, but he  was not taking his statin at that time.  His current dose of simvastatin  should be quite adequate.  His CBC was essentially normal.  His  chemistry profile shows chronic renal insufficiency with a creatinine of  1.8.   Blood pressure control is somewhat suboptimal.  He would benefit from an  ACE inhibitor to potentially stabilize renal dysfunction.  We will start  lisinopril 5 mg daily.  He will have chemistry profiles in 2 and 4  weeks.  His blood pressure will be monitored as an outpatient.  I will  see  this nice gentleman again in 8 months.    Sincerely,      Gerrit Friends. Dietrich Pates, MD, [facility]  Electronically Signed    RMR/MedQ  DD: 11/16/2008  DT: 11/17/2008  Job #: [id number]

## 2011-01-07 NOTE — H&P (Signed)
NAMEDONTARIO, EVETTS             ACCOUNT NO.:  [id number]   MEDICAL RECORD NO.:  [id number]          PATIENT TYPE:  INP   LOCATION:  A339                          FACILITY:  [facility]   PHYSICIAN:  [name], MD   DATE OF BIRTH:  May 08, 1912   DATE OF ADMISSION:  12/12/2008  DATE OF DISCHARGE:  LH                              HISTORY & PHYSICAL   HISTORY OF PRESENT ILLNESS:  This 77 year old white male was seen in the  emergency department by ED physician.  Apparently, he had developed low  back pain and right flank pain.  He does have a history of hernia and  hypertension.  He had x-rays done in the emergency department and  compression fracture noted at T11, T12, L1, and L4 with T12 fracture  appearing to be acute with paraspinal hematoma noted in this area.  The  patient had noted to have a large left inguinal hernia as well with no  evidence of bowel obstruction.  The patient was given medication for  pain in the emergency department and subsequently was admitted.   SOCIAL HISTORY:  The patient do not smoke or drink alcohol.   PAST MEDICAL HISTORY:  Hypertension, inguinal hernia, hyperlipidemia,  and previous CVA.  The patient also has history of hypertension, sick  sinus syndrome.   ALLERGIES:  No known drug allergies.   MEDICATION:  1. Furosemide 40 mg daily.  2. Lisinopril 5 mg daily.  3. Simvastatin 20 mg bedtime.  4. Aspirin 81 mg daily.   REVIEW OF SYSTEMS:  HEENT:  Negative.  CARDIOPULMONARY:  No chest pain  or dyspnea.  GI:  The patient does have episodes of constipation.  No  vomiting or bleeding.  GU:  The patient has some decreased urinary force  and dribbling.   PHYSICAL EXAMINATION:  GENERAL:  Elderly white male.  VITAL SIGNS:  Blood pressure 115/70, respirations 20, pulse 53,  temperature 97.8.  HEENT:  Eyes, PERRLA.  TMs negative.  Oropharynx benign.  NECK:  Supple.  No JVD or thyroid abnormalities.  HEART:  Regular rhythm.  No murmurs.  LUNGS:   Clear to P and A.  ABDOMEN:  No palpable organs or masses.  The patient has a large left  inguinal hernia.  NEUROLOGIC:  No focal deficit.  MUSCULOSKELETAL:  The patient has paraspinous muscle tenderness in the  thoracic spine and lumbar spine.   ASSESSMENT:  The patient admitted with back and flank pain.  He does  have x-ray evidence of compression fractures at T11, T12, L1, L4, 50% of  T12 fracture appearing acute with paraspinous hematoma, large inguinal  hernia.      [name], MD  Electronically Signed     AGM/MEDQ  D:  12/13/2008  T:  12/13/2008  Job:  [id number]

## 2011-01-07 NOTE — Discharge Summary (Signed)
[name], [name]             ACCOUNT NO.:  [id number]   MEDICAL RECORD NO.:  [id number]          PATIENT TYPE:  INP   LOCATION:  A339                          FACILITY:  [facility]   PHYSICIAN:  [name], MD   DATE OF BIRTH:  May 25, 1912   DATE OF ADMISSION:  12/15/2007  DATE OF DISCHARGE:  04/23/2010LH                               DISCHARGE SUMMARY   A 77 year old white male admitted 12/12/2008 and discharged 12/15/2008,  three days' hospitalization.   DIAGNOSES:  1. Vertebral fracture T11 and 12, L1 and L4, with T12 fracture being      acute.  2. Previous history of cerebrovascular accident.  3. Hypertension. __________   Condition is __________ at the time of discharge.   This patient began having low back pain when he when he was brought in  _to_ED_______.  He does have a history of hypertension _coronary artery  disease_________.  He had x-rays done which showed evidence of fracture  of T11 and T12 and L1 and L4, T12 fracture was apparently acute, also  __________ spinal __________  noted in this area.  The patient also has  __________.  He was given __________  and subsequently was admitted.   EXAMINATION OF THE PATIENT:  Uncomfortable elderly white male.  Blood  pressure 115/70, respirations 20, pulse 53, temperature 97.8.  HEENT:  Eyes, PERRLA, TMs negative, oropharynx benign.  NECK:  Supple, no JVD or thyroid abnormality.  HEART:  Regular rhythm, no murmurs.  LUNGS:  Clear to P and A.  ABDOMEN:  No palpable organs or masses.  MUSCULOSKELETAL:  The patient had some paraspinal muscle tenderness in  the thoracic spine and lumbar spine area.   LABORATORY DATA:  Admission CBC:  WBC 5000, hemoglobin 14.3, hematocrit  41.9, platelet count 170.  BMET:  Sodium 136, potassium 3.7, chloride  103, CO2 26, glucose 96, BUN 24, creatinine 1.47, GFR 45.  Urinalysis:  Moderate leukocytes, positive nitrites.  Urine culture greater than  100,000 Gram-negative rods.    X-RAYS:  1. CT of the abdomen without contrast:  Increased activity at T12      consistent with known compression fracture, degenerative disease      about the shoulders per [facility] joint.  The patient has what appears to      be a 50% T12 compression fracture and compression fractures of T11,      L1, L4 of uncertain chronicity.  2. MRI of the T-spine shows acute T12 vertebral compression fracture      with diffuse marrow edema, possible marrow edema involving the      right T9 vertebral body and right costovertebral junction, mild      marrow edema involving L4, chronic T11 and L1 compression      fractures, incidental T10 and T11 hemangiomas, small pleural      effusions.  3. Bone scan:  Increased activity T12 consistent with known      compression fracture, degenerative disease about the shoulders,      first [facility] joint.   HOSPITAL COURSE:  The patient at the time of his admission was  placed on  a regular diet, bedside commode, was placed on subcutaneous Lovenox 40  mg daily, Lasix 40 mg p.o. daily, lisinopril 5 mg daily, aspirin 81 mg  daily, morphine 4 mg IV q.3-4 h p.r.n. for severe pain, Tylenol 650 p.o.  p.r.n. for minor pain and fever.  The patient had a urine culture done  which showed greater than 100,000 colonies of Gram-negative rods.  The  patient remained fairly comfortable, he remained at bed rest.  A CASH  brace was ordered for the patient and physical therapy consult.  The  patient was seen in consultation by Dr. Hilda Lias.  Discussions were held  with family members and with Dr. _Bowles_________ in regard to the  possibility of kyphoplasty or vertebroplasty for T12.  This was  scheduled with Dr. Corliss Skains.  The patient could not have this done on  Friday, the date of his discharge, but this was scheduled for Monday.  It was recommended that he be __taken to [place]________ at 11:00  Monday.  The patient was transferred to [facility] for  rehab.  The patient  was sent home on the following medications:   1. Lasix 40 mg daily.  2. Prinivil 5 mg daily.  3. Aspirin 81 mg daily.  4. Macrobid 100 mg b.i.d.  5. Vicodin ES 1 every 4 hours p.r.n. for pain.   The patient was stable at the time of his transfer and discharge.      [name], MD  Electronically Signed     AGM/MEDQ  D:  12/15/2008  T:  12/15/2008  Job:  [id number]

## 2011-01-07 NOTE — Group Therapy Note (Signed)
[name], [name]             ACCOUNT NO.:  [id number]   MEDICAL RECORD NO.:  [id number]          PATIENT TYPE:  INP   LOCATION:  IC04                          FACILITY:  [facility]   PHYSICIAN:  [name], MD   DATE OF BIRTH:  1911-10-23   DATE OF PROCEDURE:  DATE OF DISCHARGE:                                 PROGRESS NOTE   A 77 year old white male was initially a patient at [name].  This patient is 1 week postop from intertrochanteric open  reduction of his right hip, a patient of Dr. Simonne Come.  The patient had  been very alert, but on morning of day of admission had diminished  sensorium to the point of obtundation, spiked a fever of 103 and was  volume depleted, transferred to the ED where he was evaluated and noted  to have a pressure of 70.  Initially, the patient had a white count of  3.9, hemoglobin 10.5.  Subsequent white count was elevated at 22,800  with hemoglobin of 10.0 and hematocrit of 29.0.  The patient did have a  D-dimer of 20.  Because of declining O2 sats, the patient was intubated  in the emergency department and subsequently admitted to intensive care  unit with the tentative diagnosis of septic shock.  The patient was  placed on intravenous antibiotics, vancomycin, Zosyn.  Also, he was  given intravenous fluids and pressor agents, vasopressin 0.03 per hour,  Levophed 30 mcg per hour, fentanyl drip 60 mcg per hour and sepsis  protocol.  Vancomycin was given 1000 mg every 24 hours and Zosyn 2.25 mg  every 8 hours and Lovenox therapeutic dose was given.  The patient did  have abnormal cardiac markers.  Initial set; CK 427, CK-MB 10, troponin  3.09.  Subsequent cardiac marker; CK 275, CK-MB 7.5, troponin 2.5.  Electrocardiogram showed right bundle-branch block with septal infarct  of undetermined age.  Initial set of blood gases showed a pH of 7.402  with a pCO2 of 21.3 and pO2 147.  The patient did have urinalysis which  showed increased  number of wbc's and urine culture showed colony count  greater than 100,000, gram-negative rods.  Hepatic panel was abnormal,  showed an elevated bilirubin 3.6, total direct bilirubin 2.6, alkaline  phosphatase 202, SGOT 237, SGPT 73.   PHYSICAL EXAMINATION:  GENERAL:  The patient remains on the ventilator.  He seems to be more alert looking around.  The patient is intubated.  VITAL SIGNS:  The BP is 91/49, pulse rate 60.  HEENT:  Eyes, PERRLA.  TMs negative.  Oropharynx benign.  NECK:  Supple.  No JVD or thyroid abnormalities.  LUNGS:  Breath sounds heard throughout both lungs.  No rales.  HEART:  Irregular rhythm.  No murmurs.  ABDOMEN:  Soft.  No palpable organs or masses.  No organomegaly.  EXTREMITIES:  Edema 2+, healing incision over the right hip.   ASSESSMENT:  The patient does have urinary tract infection with gram  negative organisms.  He was admitted with, what was felt to be septic  shock, was hypotensive, hypoxic, has  chronic atrial fibrillation,  hyperlipidemia, does have elevated liver enzymes of undetermined  etiology, does have elevated cardiac markers and abnormal EKG.  Right  bundle-branch block, septal infarct of undetermined etiology.   PLAN:  To continue current regimen.  Continue to monitor the patient in  intensive care.  We will discuss this with his doctors and obtain  Cardiology consult with Dr. Dietrich Pates, discussed with him.  Family  members to make determination concerning the code status.  The patient  is critically ill.      [name], MD  Electronically Signed     AGM/MEDQ  D:  03/19/2009  T:  03/20/2009  Job:  [id number]

## 2011-01-07 NOTE — Group Therapy Note (Signed)
[name], [name]             ACCOUNT NO.:  [id number]   MEDICAL RECORD NO.:  [id number]          PATIENT TYPE:  INP   LOCATION:  IC04                          FACILITY:  [facility]   PHYSICIAN:  [name], MD   DATE OF BIRTH:  04/10/1912   DATE OF PROCEDURE:  03/23/2009  DATE OF DISCHARGE:                                 PROGRESS NOTE   SUBJECTIVE:  This patient was extubated yesterday and taken off pressor  agents.  He has remained stable.  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   OBJECTIVE:  GENERAL:  The patient is more alert this morning.  VITAL SIGNS:  Blood pressure 123/67 and pulse rate 63.  LUNGS:  Diminished breath sounds.  HEART:  Regular rhythm.  ABDOMEN:  No palpable organs or masses.  EXTREMITIES:  The patient does have bilateral edema in both extremities.   ASSESSMENT:  The patient is being treated for sepsis secondary to  Morganella morganii.  He had a recent myocardial infarction with a small  amount of necrosis, does have bilateral pleural effusions and  consolidation of the left base, possible pneumonia.   PLAN:  To continue current regimen.  We will continue diuresis.      [name], MD  Electronically Signed     AGM/MEDQ  D:  03/23/2009  T:  03/23/2009  Job:  [id number]

## 2011-01-07 NOTE — Discharge Summary (Signed)
NAMEPAYSON, EVRARD             ACCOUNT NO.:  [id number]   MEDICAL RECORD NO.:  [id number]          PATIENT TYPE:  INP   LOCATION:  A340                          FACILITY:  [facility]   PHYSICIAN:  [name], MD   DATE OF BIRTH:  [birth year]   DATE OF ADMISSION:  03/18/2009  DATE OF DISCHARGE:  08/06/2010LH                               DISCHARGE SUMMARY   A 77 year old white male admitted on March 18, 2009, and discharged on  March 31, 2009, 12-day hospitalization.   DIAGNOSES:  1. Septic shock.  2. Urosepsis.  3. Hypotension.  4. Chronic atrial fibrillation.  5. Hyperlipidemia.  6. Acute myocardial infarction.  7. Congestive heart failure.  8. Recent history of right intertrochanteric fracture with subsequent      surgery.      [name], MD  Electronically Signed     AGM/MEDQ  D:  03/30/2009  T:  03/30/2009  Job:  [id number]

## 2011-01-07 NOTE — Group Therapy Note (Signed)
[name], [name]             ACCOUNT NO.:  [id number]   MEDICAL RECORD NO.:  [id number]          PATIENT TYPE:  INP   LOCATION:  A339                          FACILITY:  [facility]   PHYSICIAN:  [name], MD   DATE OF BIRTH:  1912/08/12   DATE OF PROCEDURE:  DATE OF DISCHARGE:                                 PROGRESS NOTE   This patient is admitted to hospital with back pain.  X-ray showed  evidence of compression fractures at T11, T12, L1, and L4 with T12  fracture appearing more acute.   OBJECTIVE:  VITAL SIGNS:  Blood pressure 153/93, respirations 20, pulse  84, temperature 98.1.  HEART:  Regular rhythm.  LUNGS:  Diminished breath sounds.  ABDOMEN:  No palpable organs or masses.  The patient does have  paraspinous muscle tenderness in mid and upper back.   ASSESSMENT:  The patient was admitted with back pain.  He does have  fractures of thoracic and lumbar vertebrae, which appear old, but the  T12 fracture appears more acute.   PLAN:  I discussed the situation with Dr. Micheline Maze from Radiology who feels  that the fracture would be amenable to kyphoplasty or vertebroplasty.  Discussions are being held with [facility] with reference to  this.  A bone scan has been ordered for today.      [name], MD  Electronically Signed     AGM/MEDQ  D:  12/15/2008  T:  12/15/2008  Job:  [id number]

## 2011-01-07 NOTE — Group Therapy Note (Signed)
[name], [name]             ACCOUNT NO.:  [id number]   MEDICAL RECORD NO.:  [id number]          PATIENT TYPE:  INP   LOCATION:  IC04                          FACILITY:  [facility]   PHYSICIAN:  Edward L. Juanetta Gosling, M.D.DATE OF BIRTH:  Oct 09, 1911   DATE OF PROCEDURE:  03/22/2009  DATE OF DISCHARGE:                                 PROGRESS NOTE   Patient of Dr. Renard Matter.  [name] was not able to be weaned any  yesterday because of his blood pressure and requirement for pressor  support.  However, this morning, he is much improved; he is more alert;  he is more awake; and his blood pressure is about 115 by the arterial  line.  He is only on 3 mcg of Levophed and he is off the Neo-Synephrine.  Overall I think he is much improved.   His exam this morning shows he does arouse.  His chest is relatively  clear with decreased breath sounds with some rhonchi.  His heart is  irregular with atrial fibrillation.  His pulse rates in the 60s, blood  pressure as mentioned is about 115 systolic.  His abdomen is soft  without masses.   His laboratory work this morning shows white count 11,700, hemoglobin  9.1, platelets 144, phosphorus is 2.7, and magnesium is 1.6.  Comp  metabolic profile shows BUN of 39 and creatinine is 1.77.  Electrolytes  were normal.  His alkaline phosphatase is still somewhat elevated at  173, SGOT still elevated at 47, and his albumin is 1.4.  He was started  on tube feedings yesterday.  His blood gas on 40%, 550 rate of 14, and 5  of PEEP shows pH 7.39, pCO2 of 31.3, and pO2 of 116.  Chest x-ray shows  fairly dense atelectasis and a pleural effusion.   ASSESSMENT:  He seems to be doing better.   PLAN:  I going to see if we can try to wean him today.  Reduce his  ventilator support and see if he will be able to off.      Edward L. Juanetta Gosling, M.D.  Electronically Signed     ELH/MEDQ  D:  03/22/2009  T:  03/22/2009  Job:  [id number]

## 2011-01-07 NOTE — Consult Note (Signed)
NAMEYATES, WEISGERBER             ACCOUNT NO.:  [id number]   MEDICAL RECORD NO.:  [id number]          PATIENT TYPE:  INP   LOCATION:  IC04                          FACILITY:  [facility]   PHYSICIAN:  Edward L. Juanetta Gosling, M.D.DATE OF BIRTH:  1911/09/03   DATE OF CONSULTATION:  DATE OF DISCHARGE:                                 CONSULTATION   Patient of Dr. Renard Matter.   REASON FOR CONSULTATION:  Respiratory failure.   HISTORY:  Karim is a 77 year old who had a fractured hip about a  week prior to admission.  He had been doing well, then spiked a  temperature, and he has felt to have probably a UTI with urosepsis.  He  has Gram-negative rods in his blood.  He eventually ended up being  intubated, placed on mechanical ventilation.  He has a history of  coronary artery occlusive disease, which is three-vessel in nature,  atrial fibrillation, hypertension, and possibly cholecystitis.  He did  have elevated LFTs as well.  He had an ultrasound done up and there was  some suggestion, there was concern about cholecystitis, but not a great  deal of sonographic evidence.  He has the history of the atrial  fibrillation, hypertension, hyperlipidemia, COPD, chronic anemia, and  coronary artery occlusive disease.  He has had a recent right  intertrochanteric hip fracture.  He has not been on Coumadin because of  the risk of fall and fracture.   MEDICATIONS:  According to the Char:  1. Aspirin 81 mg daily.  2. Lasix 20 mg daily.  3. Lexapro 5 mg daily.  4. Zocor 20 mg daily.  5. Vicodin 5/500 as needed for pain.   SOCIAL HISTORY:  He lives at the nursing home now.   REVIEW OF SYSTEMS:  Pretty much unobtainable.   In the hospital, he has been on:  1. Aspirin 81 mg per tube.  2. Lovenox.  3. Lasix 40 mg daily.  4. Protonix 40 mg daily.  5. Zosyn 2/0.25 g IV q.8 h.  6. Vancomycin 1500 mg every 4 hours.  7. He has been on both Versed.  8. He had been on Diprivan, but is not anymore.  9. He has  been on vasopressin and Levophed both of which have been      able to be decreased, but they have not been able to be weaned      totally off.   PHYSICAL EXAMINATION:  VITAL SIGNS:  His blood pressure is about 100,  but noninvasive and about 90 on the arterial line.  GENERAL:  He is awake.  He is alert.  CHEST:  Rhonchi bilaterally.  HEART:  Somewhat fairly regular to auscultation, although less likely  this is probably atrial fibrillation by the monitor.  ABDOMEN:  Soft.  EXTREMITIES:  No edema.  CENTRAL NERVOUS SYSTEM:  Grossly intact.  HEENT:  Nose and throat are clear.   LABORATORY WORK:  This morning, his white count 20,200, hemoglobin 9.1,  it was about 9.6 yesterday, and platelets 165.  Hepatic function shows  albumin is 1.4, alkaline phos 128, SGOT 57, and bilirubin 1.4.  His BMET  shows his potassium is 3.3, BUN 42, creatinine 1.82.  As mentioned, he  has a positive blood cultures with Gram-negative rod.  In his urine,  growing Morganella morganii, sensitive to ceftriaxone and tobramycin.  We do not have identification on his urine and on his blood culture  organisms as yet.  Chest x-ray yesterday shows bilateral pleural  effusions and lower lobe infiltrates.   ASSESSMENT:  He has respiratory failure, probably on the basis of  sepsis.  He is hypokalemic, he still has some elevation of his LFTs  which I suspected related to his sepsis.  He has Morganella in his urine  and I going to give him Rocephin and I am going to continue with his  other treatments.  I am not sure that he is going to be ready to be  weaned quite yet because he still on pressors, but he is better with all  that.      Edward L. Juanetta Gosling, M.D.  Electronically Signed     ELH/MEDQ  D:  03/21/2009  T:  03/21/2009  Job:  [id number]

## 2011-01-07 NOTE — Letter (Signed)
March 08, 2007    [name], MD  [address]  [place], [place] [address]   RE:  [name], [name]  MRN:  [id number]  /  DOB:  1912/01/25   Dear Thalia Party:   [name] returns to the office for continued assessment and  treatment of coronary disease, dyslipidemia, atrial fibrillation and  aortic stenosis.  Since his last visit, he has continued to do superbly.  He denies all cardiopulmonary symptoms.  Unfortunately, his wife became  ill and has subsequently required nursing home placement.  He is living  alone without much difficulty.  His meal preparation is apparently not  what it was prior to his wife's illness.   MEDICATIONS:  His only medication is:  1. Furosemide 20 mg daily.  2. Aspirin 81 mg daily.  (He was supposed to be taking a potassium supplement, but is not doing  so).   LABORATORY DATA:  Labs obtained in May were excellent.  CBC was normal,  except for a slightly elevated MCV.  Electrolytes and hepatic function  were normal.  Renal function was slightly abnormal with a BUN of 26 and  a creatinine of 1.6.  A lipid profile was only mildly abnormal with a  total cholesterol of 180, triglycerides of 64, HDL of 48 and LDL of 119.  TSH was normal.   PHYSICAL EXAMINATION:  GENERAL:  A very pleasant, sharp, nonagenarian in  no acute distress.  VITAL SIGNS:  Weight is 199, 7 pounds more than in April.  Blood  pressure 110/70, heart rate 84, respirations 16.  NECK:  No jugular venous distention.  No carotid bruits.  THORAX:  Mild kyphosis.  LUNGS:  Clear lung fields.  CARDIAC:  Normal first heart sound.  Absent aortic component of the  second heart sounds.  Grade 2/6 systolic ejection murmur at the cardiac  base.  ABDOMEN:  Soft and nontender.  No organomegaly.  EXTREMITIES:  1+ edema.   IMPRESSION:  [name] is doing generally well.  He does have mild  aortic stenosis, but Richard is asymptomatic at the present time, as is his  coronary disease.  I  would be inclined to treat his mild hyperlipidemia  and will add simvastatin 20 mg daily.  He has done well off full  anticoagulation and is not an ideal candidate.  Warfarin will not be  resumed.  He has mild chronic renal disease that is age appropriate.  He  will continue to use furosemide as needed for pedal edema.  A chemistry  profile will be checked.  I will see [name] again in 5  months.    Sincerely,      Gerrit Friends. Dietrich Pates, MD, [facility]  Electronically Signed    RMR/MedQ  DD: 03/08/2007  DT: 03/09/2007  Job #: [id number]

## 2011-01-07 NOTE — Consult Note (Signed)
NAMESIEGFRIED, VIETH NO.:  [id number]   MEDICAL RECORD NO.:  [id number]          PATIENT TYPE:  INP   LOCATION:  1827                         FACILITY:  [facility]   PHYSICIAN:  Rollene Rotunda, MD, FACCDATE OF BIRTH:  Jun 10, 1912   DATE OF CONSULTATION:  03/11/2009  DATE OF DISCHARGE:                                 CONSULTATION   PRIMARY CARE PHYSICIAN:  Dr. Butch Penny.   CARDIOLOGIST:  Gerrit Friends. Dietrich Pates, MD, [facility]   REFERRING PHYSICIAN:  Marlowe Kays, M.D.   REASON FOR CONSULTATION:  Coronary disease.   HISTORY OF PRESENT ILLNESS:  The patient is very pleasant 77 year old  gentleman with three-vessel coronary disease as described below.  He has  been managed medically.  He also has permanent atrial fibrillation.  Today he fell while in his kitchen.  There was no loss of consciousness.  Apparently has a hip fracture and needs surgical repair of this.  We are  called for preoperative evaluation to assess risks and suggest any  preoperative medical management.   The patient actually gets along relatively well.  He lives by himself.  Says he walks 100 feet to his mailbox routinely.  Goes up and down a  couple of steps to feed the cats.  With this level of activity, denies  any chest pressure, neck or arm discomfort.  Has no palpitations,  presyncope or syncope.Marland Kitchen  He denies any shortness of breath.  Has no PND  or orthopnea.   PAST MEDICAL HISTORY:  1. Coronary artery disease (left main 30% stenosis, LAD 80% mid      stenosis, OM2 80% stenosis, RCA 95% stenosis.  Catheterization      2002.  He had a well-preserved ejection fraction.  He was managed      medically as he refused bypass.).  2. Hyperlipidemia.   PAST SURGICAL HISTORY:  Kyphoplasty recently.   ALLERGIES/INTOLERANCES:  None.   MEDICATIONS:  1. Simvastatin 20 mg daily.  2. Lasix 20 mg day.  3. Lisinopril 5 mg daily.  4. Aspirin.   SOCIAL HISTORY:  The patient lives by himself.  Never  really smoked  cigarettes.  He is a retired Visual merchandiser.   FAMILY HISTORY:  Noncontributory for early coronary artery disease.   REVIEW OF SYSTEMS:  As stated in the HPI; negative for all other  systems.   PHYSICAL EXAMINATION:  GENERAL:  The patient is pleasant and in no  distress.  VITAL SIGNS:  Blood pressure 173/73, heart rate 97.  Pulse 64 and  irregular, respiratory rate 15, afebrile.  HEENT:  Eyelids are  unremarkable.  Pupils equal, round, reactive to light.  Fundi not  visualized.  Oral mucosa unremarkable.  NECK:  No jugular distention at 45 degrees.  Carotid upstroke brisk and  symmetrical.  No bruits or thyromegaly.  LYMPHATICS:  No nodes.  LUNGS:  Clear to auscultation bilaterally.  BACK:  No costovertebral angle tenderness.  CHEST:  Unremarkable.  HEART:  PMI not displaced or sustained.  S1 and S2 within normal.  No  S3.  Grade 2/6 apical systolic murmur radiating slightly out the aortic  outflow tract, no diastolic murmurs.  ABDOMEN:  Flat.  Positive bowel sounds, normal frequency and pitch.  No  bruits, rebound, guarding or midline pulsatile mass, hepatomegaly,  splenomegaly.  SKIN:  No rashes, no nodules.  EXTREMITIES:  2+ pulses throughout upper, 1+ dorsalis pedis and  posterior tibialis bilaterally, mild bilateral lower extremity edema.  NEURO:  Oriented to person, place, and time.  Cranial nerves II-XII  grossly intact, motor grossly intact.   LABORATORY DATA:  Sodium 139, potassium 3.8, BUN 26, creatinine 1.73,  hemoglobin 11.8, WBC 6.9, platelets 150,000, PT 1.2.  Chest x-ray:  No active disease.  EKG:  Atrial fibrillation, right  bundle branch block, left anterior fascicular block, low ventricular  rate to about the 40s to 70s, nonspecific lateral T-wave change.   ASSESSMENT/PLAN:  1. Preoperative evaluation.  The patient has certainly high-risk      features for the possibility of cardiovascular complications.  He      has advanced age, three-vessel  coronary artery disease, atrial      fibrillation with conduction abnormalities and a slow ventricular      rate at baseline.  He also has renal insufficiency.  I have      discussed with him that he is at high risk for events such as      death, myocardial infarction, arrhythmias and heart failure.  There      is not anything that could reasonably be done preoperatively that      would greatly reduce this risk.  He would certainly need careful      hemodynamic monitoring and followup on telemetry at least, intra      and postoperatively.  We certainly could try to address issues as      they evolve postoperatively.  The son and the patient understand      this risk.  He does not want to have conservative nonsurgical      management as he hopes to go back to an active lifestyle.  They      would accept these risks.  For now he should continue on the      medications as listed.  Data  demonstrates no benefit to adding beta blockers now.  We will be able to  assist  with postoperative management.  2.  Hypertension.  Blood  pressure slightly elevated.  This can be addressed postoperatively.  1. Atrial fibrillation.  He has not been deemed a Coumadin candidate      because of risk of falls.  This can be addressed  postoperatively      as well.      Rollene Rotunda, MD, [facility]  Electronically Signed     JH/MEDQ  D:  03/11/2009  T:  03/11/2009  Job:  [id number]

## 2011-01-07 NOTE — Group Therapy Note (Signed)
NAMETAITEN, BRAWN             ACCOUNT NO.:  [id number]   MEDICAL RECORD NO.:  [id number]          PATIENT TYPE:  INP   LOCATION:  IC04                          FACILITY:  [facility]   PHYSICIAN:  [name], MD   DATE OF BIRTH:  1911/10/23   DATE OF PROCEDURE:  DATE OF DISCHARGE:                                 PROGRESS NOTE   SUBJECTIVE:  This patient continues to improve.  He had a fairly good  night.  He is working with physical therapy.  His overall condition is  improving.  He is still diuresing.  He was placed on Aldactone  yesterday.   OBJECTIVE:  VITAL SIGNS:  Blood pressure 113/57, pulse 74, respirations  18.  LUNGS:  Clear to P and A.  HEART:  Irregular rhythm.  ABDOMEN:  No palpable organs or masses.  EXTREMITIES:  The patient does have some edema in his extremities still.   ASSESSMENT:  The patient initially following hip fracture had gram-  negative sepsis and subsequent myocardial infarction, congestive heart  failure and pneumonia.  Overall condition continues to show improvement.   PLAN:  Continue working with physical therapist.  The patient will need  nursing home placement following his hospitalization.      [name], MD  Electronically Signed     AGM/MEDQ  D:  03/27/2009  T:  03/27/2009  Job:  [id number]

## 2011-01-07 NOTE — Consult Note (Signed)
[name], [name]             ACCOUNT NO.:  [id number]   MEDICAL RECORD NO.:  [id number]          PATIENT TYPE:  INP   LOCATION:  IC04                          FACILITY:  [facility]   PHYSICIAN:  Richard Friends. Dietrich Pates, MD, FACCDATE OF BIRTH:  April 04, 1912   DATE OF CONSULTATION:  DATE OF DISCHARGE:                                 CONSULTATION   REFERRING PHYSICIAN:  [name], MD.   PRIMARY CARDIOLOGIST:  Richard Friends. Dietrich Pates, MD, [facility]   HISTORY OF PRESENT ILLNESS:  A 77 year old gentleman with known coronary  artery disease and mild aortic stenosis, now referred for evaluation of  abnormal cardiac markers and EKGs in the setting of sepsis.  Mr.  [name] was recently assessed by Dr. Diona [name] at [facility]  prior to planned ORIF for a hip fracture caused by a fall without loss  of consciousness.  Perioperative risk was considered acceptable allowing  Dr. Simonne [name] to move forth with the operation, which was uncomplicated  except for blood loss requiring transfusion of 2 units packed red blood  cells.  [name] made a slow, but acceptable recovery and was  transferred to the local skilled nursing facility a few days prior to  admission.  His Foley was left in place.  He subsequently developed  fever and a decline in mental status, prompting evaluation in the  emergency department.  His respirations became compromised resulting in  intubation and mechanical ventilation.  He subsequently became  hypotensive requiring vasopressin and norepinephrine.  He has improved  over the past 24 hours with better hemodynamics, better oxygenation, and  the recovery of his mental status.  Cardiac markers have been abnormal  as has his EKG prompting cardiology reassessment.   [name] underwent cardiac catheterization in 2002 and was found to  have three-vessel coronary artery disease with preserved left  ventricular systolic function.  CABG surgery was recommended, but the  patient refused it.  He has done well since with essentially no  cardiopulmonary symptoms.  He also has aortic stenosis, but this is mild  and not thought to be hemodynamically significant.  He has chronic  atrial fibrillation, but has not been treated with anticoagulants due to  his unstable gait and the possibility of a fall with injury, as soon as  it did occur.   PAST MEDICAL HISTORY:  Otherwise notable for hyperlipidemia.  He was  admitted last year with a spontaneous vertebral fracture treated with  kyphoplasty, tonsillectomy, cataract surgery and repair of an inguinal  hernia.  He has had sick sinus syndrome with longstanding atrial  fibrillation and some bradycardia, which has not clearly been  symptomatic.  He has chronic pedal edema without frank congestive heart  failure.   ALLERGIES:  No true allergies; he developed lightheadedness and likely  bradycardia with low-dose beta-blocker.   RECENT MEDICATIONS:  1. Simvastatin 20 mg daily.  2. Furosemide 20 mg daily.  3. Lisinopril 5 mg daily.  4. Aspirin.   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   FAMILY HISTORY:  No prominent coronary artery disease.   REVIEW OF SYSTEMS:  Recent notes in the record indicate hearing  impairment, hypertension, and substantial ecchymoses, particularly of  the right leg.  He has had a history of GERD with intermittent nausea  and emesis.  He follows a regular diet at home.  He has not had  prostatism nor urinary retention in the past.  He requires corrective  lenses for near and far vision.  There is a history of cataracts.  No  abnormalities noted for all other systems.   PHYSICAL EXAMINATION:  GENERAL:  Alert gentleman, trying to communicate  with his daughters while on the ventilator in no definite acute  distress.  VITAL SIGNS:  The weight was 99 kg on admission, height 72 inches, blood  pressure  100/60 on moderate dose norepinephrine and low dose of  vasopressin, heart rate 64 and irregular, respirations 14 and unlabored  on the ventilator.  He is afebrile.  HEENT:  EOMs intact; normal lids and conjunctivae; normal oral mucosa.  NECK:  No jugular venous distention; no carotid bruits.  ENDOCRINE:  No thyromegaly.  HEMATOCRIT:  No adenopathy.  LUNGS:  Grossly clear.  CARDIAC:  Normal first and second heart sounds; grade 2/6 basilar  systolic ejection murmur.  ABDOMEN:  Soft and nontender; decreased bowel sounds; nontender; no  organomegaly.  EXTREMITIES:  1/2+ edema; distal pulses decreased; extremities are warm  and adequately perfused.   CHEST X-RAY:  Vascular redistribution; cardiomegaly; no acute findings.   EKG:  Atrial fibrillation with controlled ventricular response on the  initial tracing; subsequent tracing shows a regular rhythm with the same  native complexes, possibly representing a junctional rhythm.  There is  borderline low voltage in limb leads, and left anterior fascicular  block, and right bundle-branch block.  Prominent T-waves reversions  noted in V4-V6 consistent with lateral ischemia.  These are more  impressive than seen on the previous EKG of March 11, 2009.   Peak CPK is 427 with an MB of 10 and a troponin of 3.1.  Urinalysis  shows large blood, 20-50 white cells, and many bacteria.  Culture is  growing a gram-negative rod as of 2 blood cultures.  Blood gas shows  hypoventilation with a pCO2 of 25, pH of 7.47 and pO2 initially of 56  prior to intubation.  The white count was 3,900 on 22,000 up to 33,000  subsequently with predominantly neutrophils.  Hemoglobin has remained  stable between 10 and 10.5.  MCV is normal.  Platelet count is normal.  His D-dimer is greater than 20.   IMPRESSION:  [name] presents with urosepsis, shock, and a small  amount of myocardial necrosis.  An echocardiogram is pending to  determine left ventricular systolic  function.  Aortic stenosis is  trivial.  It is not surprising that he suffered some myocardial damage  with known three-vessel coronary artery disease and some aortic valve  disease in the setting of severe sepsis.  There is no urgency to perform  any additional testing at present.  Treatment options are limited by the  patient's hemodynamic instability.  The potential benefit of  antithrombotic therapy is probably low and that the mechanism for this  event was supply-demand mismatch rather than an acute  coronary thrombosis.  For now, no medication will be added.  His  pressors should be tapered as rapidly as blood pressure permits.  We  appreciate the opportunity to comment on Mr. Rabenold's care and will  be happy  to continue to assist.      Richard Friends. Dietrich Pates, MD, [facility]  Electronically Signed     RMR/MEDQ  D:  03/19/2009  T:  03/20/2009  Job:  [id number]

## 2011-01-07 NOTE — Group Therapy Note (Signed)
[name], [name]             ACCOUNT NO.:  [id number]   MEDICAL RECORD NO.:  [id number]          PATIENT TYPE:  INP   LOCATION:  A340                          FACILITY:  [facility]   PHYSICIAN:  [name], MD   DATE OF BIRTH:  September 28, 1911   DATE OF PROCEDURE:  03/28/2009  DATE OF DISCHARGE:                                 PROGRESS NOTE   This patient seems to be fairly stable now.  Complains of some pain in  his left leg.  He is still diuresing with negative fluid __________.   OBJECTIVE:  VITAL SIGNS:  Blood pressure 125/66, respirations 20, pulse  77, temperature 99.2.  LUNGS:  Diminished breath sounds bilaterally.  HEART:  Regular rhythm.  EXTREMITIES:  The patient does continue to have some evidence of edema  in his extremities and scrotal area.   PERTINENT LAB WORK:  The patient's hemoglobin is 10.4 with hematocrit  29.9.  Electrolytes remain normal.  Slight elevation of blood sugar at  106.   ASSESSMENT:  The patient was admitted following hip fracture, gram-  negative sepsis, subsequent myocardial infarction, congestive heart  failure and pneumonia.  Overall condition continues to show some  improvement.   PLAN:  Continue working with physical therapist.  Will need nursing home  placement following his hospitalization.      [name], MD  Electronically Signed     AGM/MEDQ  D:  03/28/2009  T:  03/28/2009  Job:  [id number]

## 2011-01-07 NOTE — Group Therapy Note (Signed)
NAMEBERGEN, MAGNER             ACCOUNT NO.:  [id number]   MEDICAL RECORD NO.:  [id number]          PATIENT TYPE:  INP   LOCATION:  IC04                          FACILITY:  [facility]   PHYSICIAN:  Edward L. Juanetta Gosling, M.D.DATE OF BIRTH:  [birth year]   DATE OF PROCEDURE:  DATE OF DISCHARGE:                                 PROGRESS NOTE   [name] continues to improve.  He is awake and alert.  He is  eating.  He has oral lesions that look like they are hepatic and they  seem to be drying up, although they are cracked.  He is not short of  breath and he has no other new complaints.   PHYSICAL EXAMINATION:  GENERAL:  He is awake and alert.  VITAL SIGNS:  His pulses in the 60s, it looks like atrial fibrillation  with slow ventricular response.  His blood pressure in the 110-120 range  systolic.  His O2 sats in the 90s.  CHEST:  Clear.   LABORATORY DATA:  His white blood count is 7300, hemoglobin is 10,  platelets 265.  BMET shows potassium is 3.9, BUN of 20, creatinine 1.31  and chest x-ray shows improved bilateral airspace opacities and pleural  effusions.   ASSESSMENT:  He has, what is, probably a bilateral pneumonia.  He has  congestive heart failure.  He has had a hip fracture.  He is in atrial  fibrillation and he is overall doing better.  I do not plan to change  anything.      Edward L. Juanetta Gosling, M.D.  Electronically Signed     ELH/MEDQ  D:  03/26/2009  T:  03/26/2009  Job:  [id number]

## 2011-01-07 NOTE — Group Therapy Note (Signed)
NAMENARAYAN, SCULL             ACCOUNT NO.:  [id number]   MEDICAL RECORD NO.:  [id number]          PATIENT TYPE:  INP   LOCATION:  IC04                          FACILITY:  [facility]   PHYSICIAN:  [name], MD   DATE OF BIRTH:  05-27-1912   DATE OF PROCEDURE:  DATE OF DISCHARGE:                                 PROGRESS NOTE   This patient appears to be more alert today.  He did sit up some  yesterday approximately 4-5 hours in chair.  He is working with physical  therapist, but this is limited.  He continues to have some edema in  scrotal area and legs.   OBJECTIVE:  VITAL SIGNS:  Blood pressure 104/59, pulse rate 72.  LUNGS:  Clear to P and A.  HEART:  Regular rhythm.  ABDOMEN:  No palpable organs or masses.  SKIN:  The patient does have edema in extremities and scrotal area.   LABORATORY WORK:  Most recent lab work; CBC, hemoglobin 10.0, hematocrit  29.0, BNP was essentially within normal limits.   ASSESSMENT:  The patient continues to improve.  He was treated initially  for sepsis, chronic atrial fibrillation.  He has had a recent myocardial  infarction and has evidence of congestive heart failure.   PLAN:  To continue current regimen.      [name], MD  Electronically Signed     AGM/MEDQ  D:  03/26/2009  T:  03/26/2009  Job:  [id number]

## 2011-01-07 NOTE — Consult Note (Signed)
NAMEBASEM, YANNUZZI             ACCOUNT NO.:  [id number]   MEDICAL RECORD NO.:  [id number]          PATIENT TYPE:  INP   LOCATION:  2008                         FACILITY:  [facility]   PHYSICIAN:  Lucrezia Starch. Earlene Plater, M.D.  DATE OF BIRTH:  Aug 24, 1912   DATE OF CONSULTATION:  DATE OF DISCHARGE:                                 CONSULTATION   REASON FOR CONSULTATION:  Regarding scrotal swelling and pain.   HISTORY OF PRESENT ILLNESS:  The patient was admitted on May 24, 2007 for incarcerated left inguinal hernia.  He began having scrotal  pain approximately  1-1/2 weeks ago.  He was seen in the ER on May 20, 2007 by Dr.  Carolynne Edouard and the hernia was able to be reduced and pain resolved.  Discussed  with the patient at that time of performing elective hernia repair;  however, the patient has extensive coronary artery disease and needed  cardiac clearance prior to repair.  The patient returned to the hospital  on May 24, 2007 for increased scrotal pain and swelling.  The  patient also had a fall last week.   The patient has extensive cardiac history of coronary artery disease,  atrial fibrillation, aortic valve disease, and aortic stenosis.   The patient was evaluated by general surgery yesterday and felt scrotal  pain and swelling was not coming from the left inguinal hernia,  therefore, a scrotal ultrasound was ordered with results showing left  epidermitis, left moderate hydrocele, 2 small hypervascular areas  suspicious for phlegmon or early abscess.  No evidence of mass or  torsions.  A small right variceal and scrotal wall thickening.   PAST SURGICAL HISTORY:  Tonsillectomy.   PAST MEDICAL HISTORY:  As in HPI.   FAMILY HISTORY:  Father lived into his 11s and had a heart rhythm  problem.   SOCIAL HISTORY:  Retired Visual merchandiser.  No history of excessive alcohol use or  significant tobacco abuse.   REVIEW OF SYSTEMS:  Noted for scrotal pain and swelling; is essential  benign.   PHYSICAL EXAMINATION:  GENERAL:  A 77 year old Caucasian male.  He is in  no acute distress.  He is alert and oriented x3.  ABDOMEN:  Soft and nontender.  GENITALIA:  Penis, meatus within limits.  Right testicle without  abnormality.  Nontender.  Left epididymis firm and tender to palpation.  No area of tenderness felt.  Unable to feel inguinal hernia content in  scrotum.  Prostate 2+, benign.   BUN and creatinine 18/1.57.  PTT 29, PT INR 14.7/1.1, white count 6.6,  hemoglobin 13.8, hematocrit 41.5, and platelets of 204,000.   IMPRESSION:  1. Right epididymitis.  2. Significant coronary artery disease.  3. Decreased urinary function.   PLAN:  Begin IV antibiotics 400 mg Cipro IV q.24 hours.  Dr. Earlene Plater will  see the patient tomorrow and will aim for discharge Thursday or Friday.     ______________________________  Alessandra Bevels. Chase Picket, FNP-C      Ronald L. Earlene Plater, M.D.  Electronically Signed    JML/MEDQ  D:  05/25/2007  T:  05/26/2007  Job:  [id number]

## 2011-01-07 NOTE — Letter (Signed)
June 14, 2007    [name], MD  [address]  [place], [place] [address]   RE:  KAIDAN, HARPSTER  MRN:  [id number]  /  DOB:  05/07/1912   Dear Thalia Party:   Mr. Lemmerman returns to the office following a recent admission to  [facility] for a left scrotal mass.  Although this appeared to  be a recurrent incarcerated inguinal hernia, it turned out to be a  severe epididymitis with cystocele.  He was treated with antibiotics in  the hospital and discharged on a course of ciprofloxacin, which he just  finished.  He reports no pain or discomfort in that region.  He has had  no dyspnea nor chest discomfort.  His daughter has noted increased edema  and he has gained some weight.   CURRENT MEDICATIONS:  1. Furosemide 20 mg daily.  2. Aspirin 81 mg daily.  3. Simvastatin 20 mg daily.  4. Metoprolol 12.5 mg b.i.d.   PHYSICAL EXAMINATION:  A pleasant, older gentleman who is quite hard of  hearing but in no acute distress.  The weight is 200, nine pounds more than in September.  Blood pressure  130/75, heart rate 70 and regular.  Respirations 16.  NECK:  No jugular venous distension, no carotid bruits.  LUNGS:  Clear.  CARDIAC:  Normal 1st heart sound, grade 2/6 systolic ejection murmur at  the cardiac base.  Preserved aortic component of the 2nd heart sound.  ABDOMEN:  Soft and nontender.  No organomegaly.  EXTREMITIES:  2+ ankle edema.  UROLOGIC:  Swelling without tenderness in the left testicular sac.   IMPRESSION:  Mr. Burgher is doing generally well.  He has very mild  aortic stenosis which probably will never be a significant clinical  problem.  He has severe 3-vessel disease, but has not had a clinical  event despite diagnosis of this problem 6 years ago.  We will continue  appropriate medical therapy.  A lipid profile will be assessed in 2  months as well as a chemistry profile.  He will adjust his dose of  furosemide based upon the magnitude of edema.  A  chemistry profile will  be rechecked in 2 months.  I will see this nice gentleman again in 6  months.    Sincerely,      Gerrit Friends. Dietrich Pates, MD, [facility]  Electronically Signed    RMR/MedQ  DD: 06/14/2007  DT: 06/15/2007  Job #: [id number]

## 2011-01-07 NOTE — Consult Note (Signed)
NAMEELERY, CADENHEAD             ACCOUNT NO.:  [id number]   MEDICAL RECORD NO.:  [id number]          PATIENT TYPE:  INP   LOCATION:  A339                          FACILITY:  [facility]   PHYSICIAN:  J. Darreld Mclean, M.D. DATE OF BIRTH:  [birth year]   DATE OF CONSULTATION:  DATE OF DISCHARGE:                                 CONSULTATION   The patient is seen at the request of Dr. Renard Matter.   The patient is a 77 year old male who has had sudden onset of back pain  over the last week getting progressively worse.  CT scan shows  compression fracture of T12 at 50%.  The patient denies any falls or  trauma.  He has no paralysis, but it is very painful in his lower back.  He has got tightness in the lower back.  Reflexes are normal,  neurologically intact.  I reviewed the chart, herein incorporated by  reference in the history and physical, and other notes, and x-ray  reports.   IMPRESSION:  Compression fracture of T12, acute.   Will need a CASH brace, and I explained to the patient and his family  members about using the brace when he is up out of bed.  He has to take  it off when he is lying down and taking bath.   I ordered a vitamin D level, serum.   I ordered a generalized bone scan to rule out any other possible lesions  or potential other bony problems.  Other labs look good.  We will follow  with you closely and need to continue the analgesia.           ______________________________  J. Darreld Mclean, M.D.     JWK/MEDQ  D:  12/14/2008  T:  12/15/2008  Job:  [id number]

## 2011-01-07 NOTE — Group Therapy Note (Signed)
[name], [name]             ACCOUNT NO.:  [id number]   MEDICAL RECORD NO.:  [id number]          PATIENT TYPE:  INP   LOCATION:  IC04                          FACILITY:  [facility]   PHYSICIAN:  [name], MD   DATE OF BIRTH:  [birth year]   DATE OF PROCEDURE:  DATE OF DISCHARGE:                                 PROGRESS NOTE   SUBJECTIVE:  This patient remains on ventilator and apparently had a  fairly good night.  His pressor agents are gradually being decreased.  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  Dr.  Juanetta Gosling saw the patient in consultation yesterday and added Rocephin to  regimen.  He remains on vasopressin and Levophed.  Levophed is being  tapered.   PHYSICAL EXAMINATION:  VITAL SIGNS:  Blood pressure 105/68, pulse 62.  LUNGS:  Rhonchi bilaterally.  HEART:  Regular rhythm.  ABDOMEN:  No palpable organs or masses.  EXTREMITIES:  Do have some evidence of pitting edema.   ASSESSMENT:  The patient remains on ventilatory support is being treated  for sepsis secondary to Morganella morganii.  He has had recent  myocardial infarction with a small amount of necrosis.  He does have  bilateral pleural effusions and lower lobe collapse, history of atrial  fibrillation, hypertension, hyperlipidemia, COPD, recent right  intertrochanteric hip fracture postop.   LABORATORY STUDIES:  Today, his white count is 11,700 with hemoglobin  9.1, hematocrit 26.3.  Blood gases shows a pH of 7.398 with a pCO2 of  31.3, pO2 of 116.   PLAN:  To continue current regimen.  Repeat chest x-ray.      [name], MD  Electronically Signed     AGM/MEDQ  D:  03/22/2009  T:  03/22/2009  Job:  [id number]

## 2011-01-07 NOTE — Assessment & Plan Note (Signed)
[facility]                       [place] CARDIOLOGY OFFICE NOTE   NAME:[name], [name]                    MRN:          [id number]  DATE:10/17/2008                            DOB:          18-Sep-1911    CARDIOLOGIST:  Gerrit Friends. Dietrich Pates, MD, [facility]   PRIMARY CARE PHYSICIAN:  [name], MD   REASON FOR VISIT:  Followup.   HISTORY OF PRESENT ILLNESS:  [name] is a 77 year old male patient  with a history of non-ST-elevation myocardial infarction in April 2002.  At that time, he was found have three-vessel CAD.  He was referred for  bypass surgery, but he refused this.  He has had preserved LV function  in the past.  His last echocardiogram in April 2008 demonstrated normal  LV function with mild-to-moderate LV hypertrophy.  He has had evidence  of mild aortic stenosis in the past when last assessed in April 2008.  He has permanent atrial fibrillation.  He has not been felt to be an  ideal candidate for Coumadin and continues on aspirin therapy only.  In  the office today, his daughter-in-law notes that he has been complaining  of some dizziness lately.  He has been taking his medicines infrequently  because of this.  He is not very clear as to when he gets dizzy.  He  denies any syncope or near syncope.  He says he loses his balance.  He  denies any chest discomfort or shortness of breath.  He denies orthopnea  or PND.  He does have ankle edema.  He takes furosemide to control this.   CURRENT MEDICATIONS:  1. Furosemide 20 mg 2 tablets daily.  2. Aspirin 81 mg daily.  3. Simvastatin 20 mg daily.  4. Metoprolol 25 mg half tablet b.i.d.   PHYSICAL EXAMINATION:  GENERAL:  He is a well-nourished, well-developed  male in no distress.  VITAL SIGNS:  Blood pressure is 120/80, pulse 54, weight 199 pounds.  Orthostatic vital signs, blood pressure lying 125/79 with pulse of 52,  sitting 123/76 with pulse of 63, standing 148/87 with a  pulse of 64.  After 10 minutes 154/91, pulse of 75.  After 5 minutes 149/86 with pulse  of 72.  HEENT:  Normal.  NECK:  Without JVD.  CARDIAC:  S1 and S2.  Regular rate and rhythm, 2/6 systolic ejection  murmur heard best right sternal border.  LUNGS:  Clear to auscultation bilaterally.  No wheezing or rales  bilaterally.  ABDOMEN:  Soft, nontender.  EXTREMITIES:  With trace to 1+ ankle edema bilaterally.  NEUROLOGIC:  He is alert and oriented x3.  Cranial nerves II through XII  grossly intact.   Electrocardiogram reveals atrial fibrillation with heart rate of 54,  left axis deviation, interventricular conduction delay.  No significant  ST-T wave changes since previous tracing.   ASSESSMENT/PLAN:  1. Dizziness.  Etiology of his symptoms are unclear at this time.  He      does have bradycardia on his electrocardiogram.  This would likely      signify possible conduction system disease.  At age 22, he  certainly is at risk for this.  He does have some elevations of his      heart rate when he stands up.  In any event, I am going to hold his      metoprolol for now.  He is taking a very small dose of this at this      point in time.  I will then set him up for a 24-hour Holter monitor      to assess his heart rate variability during the day.  Hopefully, he      will not have significant bradycardia or significant pauses.  Given      his elevations in his heart rate with standing, I am going to cut      back on his furosemide to 20 mg 1 tablet a day.  We will also check      blood work with a CMET, CBC, TSH.  We will bring him back in      followup in the next several weeks with Dr. Dietrich Pates to review the      above and make further recommendations if any.  2. Dyslipidemia.  He continues on simvastatin.  He is due for lipids      and we will go ahead and check those with his above blood work.  3. Coronary artery disease as previously outlined.  He is having no      anginal  symptoms.  He will continue on aspirin therapy.  4. Permanent atrial fibrillation.  As noted previously, he is not a      candidate for Coumadin.  He will continue on aspirin as noted      above.   DISPOSITION:  Follow up with Dr. Dietrich Pates in the next 4 weeks or sooner  p.r.n.      Tereso Newcomer, PA-C  Electronically Signed      Gerrit Friends. Dietrich Pates, MD, [facility]  Electronically Signed   SW/MedQ  DD: 10/17/2008  DT: 10/18/2008  Job #: [id number]   cc:   [name], MD

## 2011-01-07 NOTE — Discharge Summary (Signed)
NAMEELTON, [name]             ACCOUNT NO.:  [id number]   MEDICAL RECORD NO.:  [id number]          PATIENT TYPE:  INP   LOCATION:  5003                         FACILITY:  [facility]   PHYSICIAN:  Marlowe Kays, M.D.  DATE OF BIRTH:  03/05/1912   DATE OF ADMISSION:  03/11/2009  DATE OF DISCHARGE:  03/16/2009                               DISCHARGE SUMMARY   ADDENDUM:  It was noted to [name] had a drop in his hemoglobin  postoperatively secondary to acute blood loss postop anemia.  It drop  down to 7.9 with hematocrit of 23.2.  We transfused him with 2 units of  packed cells, which brought his hemoglobin back up to 9.4 and 28.2.  This morning prior to this dictation, the patient was awake and alert  and conversive.  He is bit concerned about his future as far as  ambulation.  I assured him that he will have diligent care as well as  physical therapy and occupational therapy with ADLs, ambulation,  maintaining touch down weightbearing to the right lower extremity.  He  will continue with that for 6-8 weeks perhaps longer depending on x-ray  findings.   At present, he has a Foley catheter to facilitate urinary drainage.  He  will be transferred with his Foley, which can be discontinued when he is  little more active and at the discretion of the medical staff at the  [facility] in [place].   He also had some serosanguineous drainage from his right hip wound,  which is minimal and is expected.  We will keep him on Keflex 500 mg  q.i.d. until drainage stops.  Dry dressing can be used to the right hip  on as-needed basis.  As mentioned in the prior dictation by Dr.  Simonne Come, the patient can have refused to be seen in our office in  early August.  At that time, we will have x-rays of the right hip.   If the patient cannot maintain touchdown weightbearing for ADLs,  ambulation, etc., then he may go to very light partial (25%) to the  right hip.  He did have an  intertrochanteric fracture.  He has  osteopenia and we hesitate to allow any more weightbearing due to the  fact we may extend his fracture or even create a new one.  Any questions  concerning the patient's orthopedic care, can be directed towards Dr.  Marlowe Kays, [facility], [phone].  Any medical/cardiac  concerns, will need to be directed towards Dr. Antoine Poche of [facility]  Cardiology.   CONDITION ON DISCHARGE:  Improved, stable.      Dooley L. Cherlynn June.    ______________________________  Marlowe Kays, M.D.    DLU/MEDQ  D:  03/16/2009  T:  03/16/2009  Job:  [id number]   cc:   Rollene Rotunda, MD, [facility]

## 2011-01-10 NOTE — Consult Note (Signed)
[name]  Patient:    [name], [name]                      MRN: [id number] Proc. Date: 11/30/00 Adm. Date:  [id number] Attending:  Mirian Mo CC:         Noralyn Pick. Eden Emms, M.D. [facility]  Rollene Rotunda, M.D. [facility]  Maisie Fus C. Wall, M.D. [facility]  [facility]  CVTS   Consultation Report  REFERRING PHYSICIAN:  Noralyn Pick. Eden Emms, M.D. [facility]  PRIMARY CARDIOLOGIST:  Rollene Rotunda, M.D. [facility]  PRIMARY CARE PHYSICIAN:  [facility]  REASON FOR CONSULTATION:  Severe three-vessel coronary artery disease status post acute non-Q-wave myocardial infarction.  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 This prompted him to present to [facility] where an electrocardiogram was performed documenting atrial fibrillation. The patient was transferred directly to [facility] where he was admitted. The chest pain resolved after approximately 40 minutes. He was admitted to the hospital and ruled in for an acute non-Q-wave myocardial infarction with a very modest increase in cardiac enzymes with a peak serum troponin of 0.59 and a total CK of 62 with a CK-MB fraction of 4.7. The patient remained stable following hospitalization and subsequently underwent elective cardiac catheterization today by Dr. Eden Emms. This demonstrates severe three-vessel coronary artery disease with preserved left ventricular function. The patient is referred for a possible elective coronary artery bypass grafting.  REVIEW OF SYSTEMS:  The patient has been remarkably active and healthy for the majority of his life. He states that he saw Dr. Renard Matter at one point in the distant past and was told that he had an irregular rhythm, otherwise, he has had no  cardiac history what so ever. He is not aware of any previous myocardial infarctions or episodes of significant chest discomfort. He does report that he has had mild shortness of breath with physical exertion which has been stable over the last few years. He remains quite functional and reasonably active, although his activity has been limited as he has to care for his wife at home who suffers from mild senile dementia. The patient denies any problems with resting shortness of breath, PND, orthopnea, or lower extremity edema. He denies any problems with syncope, although he says he has had one or two episodes where he has gotten lightheaded. He is not aware of any problems with palpitations. His appetite is good. He has been having some trouble with constipation, but he otherwise has been feeling well. He denies any known history of hematochezia, hematemesis or melena. His weight has been stable. He has no history of TIA or amaurosis fugax. He denies any problems with lower extremity claudication. He has no recent problems with fevers, chills or productive cough. The remainder of his review of systems is unremarkable.  PAST MEDICAL HISTORY:  Past medical history is notable for the absence of any known history of hypertension, diabetes, stroke, coronary artery disease, congestive heart failure. The patient has been very healthy and has not needed to seek medical attention for any significant problems for many, many years.  PAST SURGICAL HISTORY:  Notable only for a tonsillectomy in the remote past.  FAMILY HISTORY:  Notable for the absence of any early onset coronary artery disease. His father lived  well into his 90s but also suffered from an irregular heart rhythm.  SOCIAL HISTORY:  The patient lives with his wife and has a son who is supportive and lives nearby. He cares for his wife and he is the sole caretaker and Talaysha Freeberg. He is a retired Visual merchandiser. He has no ongoing history  of tobacco or significant alcohol consumption.  ADMISSION MEDICATIONS:  None.  ALLERGIES:  No known drug allergies or sensitivities.  PHYSICAL EXAMINATION:  GENERAL:  A well appearing elderly male who appears of stated age of if not somewhat younger and in in no acute distress.  VITAL SIGNS:  He is in atrial fibrillation by telemetry monitor with heart rate in the 70s. He is normotensive and afebrile.  HEENT:  Exam is essentially within normal limits.  NECK:  Supple. There is no cervical or supraclavicular lymphadenopathy. There is no jugular venous distension. No carotid bruit are noted.  SKIN:  Healthy, clean and dry throughout and looks somewhat younger than that of his stated age.  LUNGS:  Auscultation of the chest reveals clear and symmetrical breath sounds bilaterally. No wheezes or rhonchi are noted.  CARDIOVASCULAR:  Notable for irregularly irregular heart rhythm, no murmurs or rubs are identified.  ABDOMEN:  Soft and nontender. The liver edge is not palpable. No masses are identified.  EXTREMITIES:  Warm and well perfused. There is no lower extremity edema. There are no changes of venous insufficiency. Distal pulses are palpable although slightly diminished in both lower legs.  NEUROLOGIC:  Grossly nonfocal and symmetrical throughout.  RECTAL:  Deferred.  GENITOURINARY:  Deferred.  DIAGNOSTIC DATA:  Cardiac catheterization films performed today are reviewed. These demonstrate severe three-vessel coronary artery disease with normal left ventricular function. Specifically, there is a 30% stenosis of the distal left main coronary artery with 30-50% stenosis of the proximal left anterior descending artery. There is 90% calcified stenosis of the mid portion of this  vessel arising after a large second diagonal branch. There is 30-50% proximal stenosis of the left circumflex coronary artery with 30% proximal stenosis of a very large first circumflex marginal  branch. There is 80-90% proximal stenosis of a small to medium sized second circumflex marginal branch. There is 95% proximal stenosis of a large and dominant right carotid artery. There is 50% stenosis of the distal portion of this vessel which gives off a large posterior descending artery and very small posterolateral branches. The left ventricular function appears normal with ejection fraction estimated in excess of 60%.  IMPRESSION:  Severe three-vessel coronary artery disease with Class IV unstable angina and positive acute non-Q-wave myocardial infarction in the setting of atrial fibrillation which may in fact be chronic.  [name] looks remarkably active and well preserved for his age and as such, represents a potential candidate for elective coronary artery bypass grafting. However, he will still certainly remain at some increased risk for complications due to his advanced age.  PLAN:  I have outlined at length the indications, risks and potential benefits of coronary artery bypass grafting with [name]. His wife and son are not currently present and he is somewhat reluctant to consider something as aggressive as open heart surgery given his age and associated issues at home. He desires to discuss things further with his family to make a decision at some point in the near future regarding further treatment. We have discussed alternative forms of therapy including long term medical therapy with the possibility of percutaneous based intervention. All of his questions  have been addressed. DD:  11/30/00 TD:  11/30/00 Job: [id number]

## 2011-01-10 NOTE — Procedures (Signed)
NAMEJOSMAR, [name]             ACCOUNT NO.:  [id number]   MEDICAL RECORD NO.:  [id number]          PATIENT TYPE:  OUT   LOCATION:  RAD                           FACILITY:  [facility]   PHYSICIAN:  Gerrit Friends. Dietrich Pates, MD, FACCDATE OF BIRTH:  1912/07/02   DATE OF PROCEDURE:  11/25/2006  DATE OF DISCHARGE:                                ECHOCARDIOGRAM   REFERRING:  [name], M.D.   CLINICAL DATA:  A 77 year old gentleman with congestive heart failure.  M-mode:  Aorta 4.2, left atrium 5.8, septum 1.8, posterior wall 1.5, LV  diastole 3.8, LV systole 2.8.   1. Technically adequate echocardiographic study.  2. Moderate left and right atrial enlargement; normal right      ventricular size and function; borderline RVH.  3. Moderate sclerosis of the aortic valve with moderate impairment in      leaflet separation; mild insufficiency and very mild stenosis by      Doppler.  4. Mild dilatation of the aortic root; mild calcification of the wall.  5. Normal mitral valve; mild regurgitation.  6. Normal tricuspid valve; moderate regurgitation; mild elevation in      estimated RV systolic pressure.  7. Normal pulmonic valve and proximal pulmonary artery.  8. Normal left ventricular size; mild to moderate hypertrophy; normal      systolic function.  9. IVC at the upper limit of normal; decrease in diameter with      inspiration at the lower limit of normal.  10.Comparison with prior study of October 27,2004:  Modest progression      in aortic valve disease.      Gerrit Friends. Dietrich Pates, MD, [facility]  Electronically Signed     RMR/MEDQ  D:  11/26/2006  T:  11/26/2006  Job:  [id number]

## 2011-01-10 NOTE — Discharge Summary (Signed)
[place]. [facility]  Patient:    [name], [name]                      MRN: [id number] Adm. Date:  [id number] Disc. Date: 12/01/00 Attending:  Mirian Mo Dictator:   Brita Romp, P.A.C. CC:         [name], M.D. [facility]  [name] [place], M.D.   Discharge Summary  DISCHARGE DIAGNOSES: 1. Coronary artery disease, status post non-Q wave myocardial infarction,    status post cardiac catheterization. 2. Paroxysmal atrial fibrillation.  HOSPITAL COURSE:  The patient presented to the emergency room from [facility] where he had been seen for some chest pain.  His chest pain seemed to originate in the left axillary region with radiation to the left arm. He was unable to really describe or rate the pain, however, he noted that he was kind of cold and clammy during the pain.  He had no prior pain of this type.  He was seen and admitted by Rollene Rotunda, M.D.  He felt that while there were some atypical features, he felt that the patients pain was worrisome at his age.  He was started on Lovenox, aspirin, and beta blockers. He was also scheduled for a cardiac catheterization on Monday, April 8. The evening of admission, the patient was noted by nursing to have approximately 1.8 second pause while in atrial fibrillation.  Vital signs were stable at that time.  No treatments were changed.  The following morning, the patient denied any chest pain or shortness of breath.  CK and MB fraction remained relatively stable, however, his troponin I trended up to a level of 0.59.  The next day, the patient denied any chest pain or shortness of breath.  His total cholesterol was noted to be 171, triglycerides 70, HDL 32, total cholesterol/HDL ratio of 5.3 with an LDL of 125.  As a result he was started on Lipitor 10 mg daily.  On April 8, the patient was taken to the catheterization lab by Theron Arista C. Eden Emms, M.D.  In the left main coronary artery  there was a distal 30% stenosis. In the left anterior descending artery had multiple 40% lesions proximally.  There was also an 80% midvessel lesion at the takeoff of the second diagonal.  The second diagonal itself had 40% multiple discrete lesions.  The first diagonal branch had multiple 30% discrete lesions.  The circumflex artery  was nondominant.  There was 50% tubular calcified proximal disease.  The first obtuse marginal had 30% multiple discrete lesions and the second obtuse marginal had diffuse 80% disease.  In the right coronary artery, which was dominant, there was a 95% eccentric proximal lesion.  There was also a 50 to 60% tubular disease distally.  Posterior and lateral system had 50% multiple discrete lesions with diffusely diseased distal portion of the artery.  Ejection fraction was greater than 60%.  There was no gradient across the aortic valve and no mitral regurgitation.  It was unclear to Dr. Eden Emms as to which artery was the culprit lesion.  He felt that while the patient is 77, he is quite active and felt that a CVTS consult would be appropriate.  Later that day, the patient was seen by Salvatore Decent. Cornelius Moras, M.D. of CVTS.  He felt that while the patient was 77 years old, he felt that the patient would be an acceptable candidate for CABG.  However, the patient was rather  reluctant to make a decision at that time and wished to discuss the matter further with his family.  The following morning, the patient stated that he felt well, had no further chest pain or shortness of breath.  Dr. Chales Abrahams felt that it was appropriate to start the patient on low dose Altace and continue his Lopressor.  Later that day, the patient again met with Dr. Cornelius Moras along with his family.  His questions were addressed, however, the patient still was reluctant to make a decision. Dr. Cornelius Moras stated that if the patient did decide to go home he would be more than willing to see the patient in the office  and discuss the matter further.  LABORATORY DATA:  Sodium 135, potassium 3.9, chloride 100, CO2 31, BUN 19, creatinine 1.3.  White count 6.0, hemoglobin 13.9., hematocrit 40.8, platelets 222.  Peak cardiac enzymes; CK 62, CK-MB 4.7, troponin I 0.59.  TSH was 4.179.  Chest x-ray revealed cardiomegaly without any active disease.  Electrocardiogram on April 6, revealed atrial fibrillation with slow response. There was noted to be a left axis deviation.  QRS was 0.094, QTC 0.451, axis -57.  Of note, the patient was in sinus rhythm at the time of discharge. DD:  12/01/00 TD:  12/01/00 Job: [id number]

## 2011-01-10 NOTE — Procedures (Signed)
   NAME:  [name], [name]                       ACCOUNT NO.:  [id number]   MEDICAL RECORD NO.:  [id number]                   PATIENT TYPE:  OUT   LOCATION:  RAD                                  FACILITY:  [facility]   PHYSICIAN:  Thomas C. [name], M.D.                DATE OF BIRTH:  01-Oct-1911   DATE OF PROCEDURE:  DATE OF DISCHARGE:                                  ECHOCARDIOGRAM   TWO-DIMENSIONAL ECHOCARDIOGRAM:   INDICATION:  Unspecified cardiovascular disease (429.2).   CLINICAL INFORMATION:  The echocardiogram is of adequate technical quality.   CONCLUSIONS:  1. Moderate left atrial enlargement.  2. Normal left ventricular chamber size and overall systolic function.  3. Disproportionate upper septal [name] thickness with mild left ventricular     hypertrophy as well.  There is no outflow track obstruction.  4. Mild mitral regurgitation.  Aortic valve sclerosis which is trileaflet.     There is decreased aortic valve mobility.  There is mild aortic     insufficiency.  5. Normal right-sided structures and function.  6. No pericardial effusion.        ___________________________________________                                            [name], M.D.   TCW/MEDQ  D:  06/21/2003  T:  06/21/2003  Job:  [id number]   cc:   [name], M.D.  [address]  [place]  [place] [address]  Fax: [phone]   Vida Roller, M.D.  Fax: [phone]

## 2011-01-10 NOTE — Cardiovascular Report (Signed)
[place]. [facility]  Patient:    [name], [name] Visit Number: [id number] MRN: [id number]          Service Type: MED Location: [phone] Attending Physician:  Mirian Mo Proc. Date: 11/30/00 Admit Date:  11/27/2000   CC:         Rollene Rotunda, M.D. [facility]             [facility]                        Cardiac Catheterization  PROCEDURE PERFORMED:  Coronary arteriography.  INDICATIONS:  Subendocardial MI, unstable angina.  CORONARY ARTERIOGRAPHY:  Left main coronary artery:  The left main coronary artery had a distal 30% stenosis.  Left anterior descending artery:  The left anterior descending artery had 40% multiple discrete lesions proximally.  There was an 80% mid vessel lesion just at the takeoff of the second diagonal branch.  This vessel had 40% multiple discrete lesions.  First diagonal branch had 30% multiple discrete lesions.  Circumflex coronary artery:  The circumflex coronary artery was nondominant. There was 50% tubular calcified proximal disease.  The first obtuse marginal branch had 30% multiple discrete lesions.  The second obtuse marginal branch had diffuse 80% disease.  Right coronary artery:  The right coronary artery was dominant.  There was a 95% eccentric proximal lesion.  There was 50-60% tubular disease distally.  Posterolateral system had 50% multiple discrete lesions with a very diffusely diseased most distal portion of the artery.  RIGHT ANTERIOR OBLIQUE VENTRICULOGRAPHY:  The RAO ventriculography was normal. Ejection fraction was in excess of 60%.  There is no gradient across the aortic valve and no MR.  LV pressure was 136/14, aortic pressure is 134/75.  IMPRESSION:  It is difficult to know what the patients infarct artery or culprit vessel is.  He did not have any clues in regards to his electrocardiogram and both the right coronary artery and left anterior descending were quite  tight.  Although the patient is 3, he is still fairly active and I think that an initial surgical consultation is in order.  He would be at fairly high risk for angioplasty and I am not sure whether it would be appropriate to attempt the right or the left anterior descending first.  Again, there were no wall motion abnormalities to go by either.  The patient has three-vessel disease and would clearly benefit by a CABG if this is possible. Attending Physician:  Mirian Mo DD:  11/30/00 TD:  11/30/00 Job: [id number]

## 2011-01-10 NOTE — Letter (Signed)
December 04, 2006    [name], MD  [address]  [place], [place] [address]   RE:  SHANAN, MCMILLER  MRN:  [id number]  /  DOB:  22-Jul-1912   Dear Thalia Party,   Mr. Booher returned to the office after a long hiatus for continued  assessment and treatment of hyperlipidemia, chronic atrial fibrillation,  and hypertension.  Catheterization, seven years ago, showed moderate  coronary disease, treated medically.  He has done amazingly well with no  cardiopulmonary symptoms.  He was recently seen in your office and given  metoprolol, in addition to a renewal of his prescription for furosemide.  When he took the former, he developed dizziness.  Accordingly, he  discontinued that medication.  When he took the latter at a total dose  of 60 mg per day, he also developed dizziness.  Accordingly, he reduced  the dose to 20 mg daily.  Since then, he has felt fine.   ON EXAM:  Elderly gentleman, somewhat slow to respond, but intact and  very pleasant.  The weight is 192, six pounds less than in March of last  year.  Blood pressure 110/60 without orthostatic change, heart rate 70  and irregular, respirations 16.  NECK:  No jugular venous distention; no carotid bruits.  LUNGS:  Clear.  CARDIAC:  Irregular rhythm, normal first and second heart sounds, modest  early systolic ejection murmur.  ABDOMEN:  Soft and nontender; no masses, no organomegaly.  EXTREMITIES:  There is 2+ pitting pretibial edema.   The only laboratory available is a chemistry profile from ten days ago,  which was normal.   Rhythm strip shows atrial fibrillation with an occasional aberrant beat,  versus PVC; the heart rate is well-controlled.   IMPRESSION:  Mr. Gayden is doing superbly.  He previously was treated  with a statin, but this was discontinued a few years ago.  I will obtain  a lipid profile, but I am not entirely enthusiastic about increasing the  complexity of this nice gentleman's medical regimen.   Metoprolol  probably resulted in excessive slowing of his heart rate.  He will not  be able to take that medication at all.  Although he has significant  edema, this is not causing any major problems.  I would not push his  dose of diuretic.  We will monitor electrolytes, as well as obtaining a  CBC and TSH level.  I will plan to see this nice gentleman again in  three months and then at yearly intervals.    Sincerely,      Gerrit Friends. Dietrich Pates, MD, [facility]  Electronically Signed    RMR/MedQ  DD: 12/04/2006  DT: 12/04/2006  Job #: [id number]

## 2011-06-05 LAB — COMPREHENSIVE METABOLIC PANEL
Albumin: 2.6 — ABNORMAL LOW
Alkaline Phosphatase: 70
BUN: 18
Creatinine, Ser: 1.57 — ABNORMAL HIGH
GFR calc Af Amer: 50 — ABNORMAL LOW
Potassium: 3.9
Total Protein: 6.6

## 2011-06-05 LAB — CBC
HCT: 38.7 — ABNORMAL LOW
HCT: 41.5
HCT: 41.2
Hemoglobin: 13.7
MCHC: 33.6
MCV: 97.7
Platelets: 214
Platelets: 204
RDW: 12.5
RDW: 12.5
RDW: 12.4
WBC: 6.3

## 2011-06-05 LAB — BASIC METABOLIC PANEL
BUN: 26 — ABNORMAL HIGH
CO2: 27
CO2: 26
Chloride: 105
GFR calc non Af Amer: 40 — ABNORMAL LOW
Glucose, Bld: 84
Glucose, Bld: 94
Potassium: 3.9
Potassium: 3.8
Sodium: 135

## 2011-06-05 LAB — DIFFERENTIAL
Basophils Absolute: 0
Eosinophils Relative: 0
Lymphocytes Relative: 10 — ABNORMAL LOW
Monocytes Absolute: 0.6

## 2011-06-05 LAB — APTT: aPTT: 29

## 2013-11-19 ENCOUNTER — Emergency Department (HOSPITAL_COMMUNITY): Payer: Medicare Other

## 2013-11-19 ENCOUNTER — Emergency Department (HOSPITAL_COMMUNITY)
Admission: EM | Admit: 2013-11-19 | Discharge: 2013-11-20 | Disposition: A | Discharge status: Home / Self Care | Payer: Medicare Other | Attending: Emergency Medicine | Admitting: Emergency Medicine

## 2013-11-19 DIAGNOSIS — S022XXA Fracture of nasal bones, initial encounter for closed fracture: Secondary | ICD-10-CM | POA: Insufficient documentation

## 2013-11-19 DIAGNOSIS — S61409A Unspecified open wound of unspecified hand, initial encounter: Secondary | ICD-10-CM | POA: Insufficient documentation

## 2013-11-19 DIAGNOSIS — Y929 Unspecified place or not applicable: Secondary | ICD-10-CM | POA: Insufficient documentation

## 2013-11-19 DIAGNOSIS — W1809XA Striking against other object with subsequent fall, initial encounter: Secondary | ICD-10-CM | POA: Insufficient documentation

## 2013-11-19 DIAGNOSIS — S0181XA Laceration without foreign body of other part of head, initial encounter: Secondary | ICD-10-CM

## 2013-11-19 DIAGNOSIS — S61412A Laceration without foreign body of left hand, initial encounter: Secondary | ICD-10-CM

## 2013-11-19 DIAGNOSIS — Y9389 Activity, other specified: Secondary | ICD-10-CM | POA: Insufficient documentation

## 2013-11-19 DIAGNOSIS — S0180XA Unspecified open wound of other part of head, initial encounter: Secondary | ICD-10-CM | POA: Insufficient documentation

## 2013-11-19 NOTE — ED Notes (Signed)
Pt to department via EMS.  Per report, pt fell from wheelchair.  Noted deviation of nose, as well as hematoma and small laceration above left eye.

## 2013-11-19 NOTE — ED Notes (Signed)
Pt is very poor historian, and family is not available at present. When family arrives, will obtain remainder of information for triage.

## 2013-11-19 NOTE — ED Provider Notes (Signed)
CSN: [id number]     Arrival date & time 11/19/13  2258 History  This chart was scribed for [name], MD by Dorothey Baseman, ED Scribe. This patient was seen in room APA16A/APA16A and the patient's care was started at 11:31 PM.    Chief Complaint  Patient presents with  . Fall   The history is provided by the patient, the EMS personnel and a relative (daughter-in-law). No language interpreter was used.   HPI Comments: [name] is a 80 y.o. Male brought in by EMS who presents to the Emergency Department complaining of a fall that occurred PTA when patient reports that "my head started spinning," causing him to fall from his wheelchair (or possibly bed, per daughter-in-law) and hit his head on the floor. Patient presents to the ED with a deviation to the nose, a laceration above the left eye, and hematomas about the scalp. The bleeding is well-controlled at this time. It appears as though the patient had an episode of epistaxis PTA that has since resolved, but then started again with palpation of the nose. His daughter-in-law reports that the patient appears to be acting normally for his baseline. She reports that the patient takes aspirin daily, but denies any other anticoagulant medication use. He denies neck pain. Patient has a history of HTN, hyperlipidemia, and stroke.  No past medical history on file. No past surgical history on file. No family history on file. History  Substance Use Topics  . Smoking status: Not on file  . Smokeless tobacco: Not on file  . Alcohol Use: Not on file    Review of Systems  A complete 10 system review of systems was obtained and all systems are negative except as noted in the HPI and PMH.    Allergies  Review of patient's allergies indicates not on file.  Home Medications  No current outpatient prescriptions on file.  There were no vitals taken for this visit.  Physical Exam  Nursing note and vitals reviewed. Constitutional: He is  oriented to person, place, and time. He appears well-developed and well-nourished.  HENT:  Head: Normocephalic.  Bleeding from the right nare. Obvious deformity of nasal bridge. 2 cm laceration to the medial aspect of the left eyebrow. No trismus. No other obvious facial injury.   Eyes: EOM are normal.  Neck: Normal range of motion.  No C spine tenderness.   Cardiovascular: Normal rate, regular rhythm, normal heart sounds and intact distal pulses.   Pulmonary/Chest: Effort normal and breath sounds normal. No respiratory distress.  Abdominal: Soft. He exhibits no distension. There is no tenderness.  Musculoskeletal: Normal range of motion.  Neurological: He is alert and oriented to person, place, and time.  Skin: Skin is warm and dry.  Skin tear to the left thumb.   Psychiatric: He has a normal mood and affect. Judgment normal.    ED Course  Procedures  FRACTURE REDUCTION PROCEDURE NOTE: Patient identification was confirmed, consent was obtained .scr.  This procedure was performed at 12:01 AM by [name], MD Site: nasal bones Anesthetic used (type and amt): none Pre-procedure N/V exam # of attempts: 1 Type of splint: none Pt anesthetized, fx/dislocation reduced successfully.  Patient tolerated procedure well without complications.  Patient splinted. Post-procedure exam indicates patient is n/v intact distal to the injury site.    LACERATION REPAIR Performed by: [name] Consent: Verbal consent obtained. Risks and benefits: risks, benefits and alternatives were discussed Patient identity confirmed: provided demographic data Time  out performed prior to procedure Prepped and Draped in normal sterile fashion Wound explored Laceration Location: Left forehead Laceration Length: 2 cm No Foreign Bodies seen or palpated Anesthesia: local infiltration Local anesthetic: lidocaine 1 % without epinephrine Anesthetic total: 7 ml Irrigation method: syringe Amount of  cleaning: standard Skin closure: 4-0 Prolene  Number of sutures or staples: 5  Technique: Running interlocked  Patient tolerance: Patient tolerated the procedure well with no immediate complications.  LACERATION REPAIR Performed by: Lyanne CoAMPOS,Othon Guardia M Consent: Verbal consent obtained. Risks and benefits: risks, benefits and alternatives were discussed Patient identity confirmed: provided demographic data Time out performed prior to procedure Prepped and Draped in normal sterile fashion Wound explored Laceration Location: Base of left thumb on the dorsal surface Laceration Length: 2 cm No Foreign Bodies seen or palpated Anesthesia: None  Amount of cleaning: standard Skin closure: Dermabond  Number of sutures or staples: Dermabond  Technique: Dermabond  Patient tolerance: Patient tolerated the procedure well with no immediate complications.   DIAGNOSTIC STUDIES:   COORDINATION OF CARE: 11:58 PM- Ordered CTs of the C spine, head, and maxillofacial. Discussed that the laceration will need to be repaired with sutures. Discussed treatment plan with patient and family at bedside and both verbalized agreement.    Labs Review Labs Reviewed - No data to display Imaging Review No results found.   EKG Interpretation None      MDM   Final diagnoses:  Nasal bone fracture  Facial laceration  Laceration of left hand    Nasal fracture after fall.  Nasal bones reduced at the bedside with significant improvement in alignment.  Patient can breathe well through both nostrils.  Small amount of epistaxis on the right controlled with pressure.  Laceration of his forehead and the base of his left thumb repaired.  No injury to the tendons of the left thumb.  This is a very superficial laceration/skin tear.  C-spine and head CT without acute traumatic pathology.  Patient baseline mental status.  Patient without complaints at this time.  Family given laceration instructions.  Family given head  injury and infection warnings.  I personally performed the services described in this documentation, which was scribed in my presence. The recorded information has been reviewed and is accurate.       Lyanne CoKevin M Zykiria Bruening, MD 11/20/13 [phone]

## 2013-11-20 MED ORDER — BACITRACIN ZINC 500 UNIT/GM EX OINT
TOPICAL_OINTMENT | CUTANEOUS | Status: AC
  Filled 2013-11-20: qty 0.9

## 2013-11-20 MED ORDER — LIDOCAINE HCL (PF) 1 % IJ SOLN
INTRAMUSCULAR | Status: DC
Start: 2013-11-20 — End: 2013-11-20
  Filled 2013-11-20: qty 5

## 2013-11-20 NOTE — ED Notes (Signed)
Remains at baseline mentation. Denies pain except when I was cleaning face. No further nose bleeding. Family has been instructed by Dr.Campos in follow up care. Pt's diaper changed prior to going home. Able to help transfer himself from stretcher to w/c. Then from w/c to car with assistance.

## 2013-11-20 NOTE — Discharge Instructions (Signed)
Head Injury, Adult °You have received a head injury. It does not appear serious at this time. Headaches and vomiting are common following head injury. It should be easy to awaken from sleeping. Sometimes it is necessary for you to stay in the emergency department for a while for observation. Sometimes admission to the hospital may be needed. After injuries such as yours, most problems occur within the first 24 hours, but side effects may occur up to 7 10 days after the injury. It is important for you to carefully monitor your condition and contact your health care provider or seek immediate medical care if there is a change in your condition. °WHAT ARE THE TYPES OF HEAD INJURIES? °Head injuries can be as minor as a bump. Some head injuries can be more severe. More severe head injuries include: °· A jarring injury to the brain (concussion). °· A bruise of the brain (contusion). This mean there is bleeding in the brain that can cause swelling. °· A cracked skull (skull fracture). °· Bleeding in the brain that collects, clots, and forms a bump (hematoma). °WHAT CAUSES A HEAD INJURY? °A serious head injury is most likely to happen to someone who is in a car wreck and is not wearing a seat belt. Other causes of major head injuries include bicycle or motorcycle accidents, sports injuries, and falls. °HOW ARE HEAD INJURIES DIAGNOSED? °A complete history of the event leading to the injury and your current symptoms will be helpful in diagnosing head injuries. Many times, pictures of the brain, such as CT or MRI are needed to see the extent of the injury. Often, an overnight hospital stay is necessary for observation.  °WHEN SHOULD I SEEK IMMEDIATE MEDICAL CARE?  °You should get help right away if: °· You have confusion or drowsiness. °· You feel sick to your stomach (nauseous) or have continued, forceful vomiting. °· You have dizziness or unsteadiness that is getting worse. °· You have severe, continued headaches not  relieved by medicine. Only take over-the-counter or prescription medicines for pain, fever, or discomfort as directed by your health care provider. °· You do not have normal function of the arms or legs or are unable to walk. °· You notice changes in the black spots in the center of the colored part of your eye (pupil). °· You have a clear or bloody fluid coming from your nose or ears. °· You have a loss of vision. °During the next 24 hours after the injury, you must stay with someone who can watch you for the warning signs. This person should contact local emergency services (911 in the [place] if you have seizures, you become unconscious, or you are unable to wake up. °HOW CAN I PREVENT A HEAD INJURY IN THE FUTURE? °The most important factor for preventing major head injuries is avoiding motor vehicle accidents.  To minimize the potential for damage to your head, it is crucial to wear seat belts while riding in motor vehicles. Wearing helmets while bike riding and playing collision sports (like football) is also helpful. Also, avoiding dangerous activities around the house will further help reduce your risk of head injury.  °WHEN CAN I RETURN TO NORMAL ACTIVITIES AND ATHLETICS? °You should be reevaluated by your health care provider before returning to these activities. If you have any of the following symptoms, you should not return to activities or contact sports until 1 week after the symptoms have stopped: °· Persistent headache. °· Dizziness or vertigo. °· Poor attention and concentration. °·   Confusion. °· Memory problems. °· Nausea or vomiting. °· Fatigue or tire easily. °· Irritability. °· Intolerant of bright lights or loud noises. °· Anxiety or depression. °· Disturbed sleep. °MAKE SURE YOU:  °· Understand these instructions. °· Will watch your condition. °· Will get help right away if you are not doing well or get worse. °Document Released: 08/11/2005 Document Revised: 06/01/2013 Document Reviewed:  04/18/2013 °[employer]® Patient Information ©2014 [employer]. °Laceration Care, Adult °A laceration is a cut or lesion that goes through all layers of the skin and into the tissue just beneath the skin. °TREATMENT  °Some lacerations may not require closure. Some lacerations may not be able to be closed due to an increased risk of infection. It is important to see your caregiver as soon as possible after an injury to minimize the risk of infection and maximize the opportunity for successful closure. °If closure is appropriate, pain medicines may be given, if needed. The wound will be cleaned to help prevent infection. Your caregiver will use stitches (sutures), staples, wound glue (adhesive), or skin adhesive strips to repair the laceration. These tools bring the skin edges together to allow for faster healing and a better cosmetic outcome. However, all wounds will heal with a scar. Once the wound has healed, scarring can be minimized by covering the wound with sunscreen during the day for 1 full year. °HOME CARE INSTRUCTIONS  °For sutures or staples: °· Keep the wound clean and dry. °· If you were given a bandage (dressing), you should change it at least once a day. Also, change the dressing if it becomes wet or dirty, or as directed by your caregiver. °· Wash the wound with soap and water 2 times a day. Rinse the wound off with water to remove all soap. Pat the wound dry with a clean towel. °· After cleaning, apply a thin layer of the antibiotic ointment as recommended by your caregiver. This will help prevent infection and keep the dressing from sticking. °· You may shower as usual after the first 24 hours. Do not soak the wound in water until the sutures are removed. °· Only take over-the-counter or prescription medicines for pain, discomfort, or fever as directed by your caregiver. °· Get your sutures or staples removed as directed by your caregiver. °For skin adhesive strips: °· Keep the wound clean and  dry. °· Do not get the skin adhesive strips wet. You may bathe carefully, using caution to keep the wound dry. °· If the wound gets wet, pat it dry with a clean towel. °· Skin adhesive strips will fall off on their own. You may trim the strips as the wound heals. Do not remove skin adhesive strips that are still stuck to the wound. They will fall off in time. °For wound adhesive: °· You may briefly wet your wound in the shower or bath. Do not soak or scrub the wound. Do not swim. Avoid periods of heavy perspiration until the skin adhesive has fallen off on its own. After showering or bathing, gently pat the wound dry with a clean towel. °· Do not apply liquid medicine, cream medicine, or ointment medicine to your wound while the skin adhesive is in place. This may loosen the film before your wound is healed. °· If a dressing is placed over the wound, be careful not to apply tape directly over the skin adhesive. This may cause the adhesive to be pulled off before the wound is healed. °· Avoid prolonged exposure to sunlight   sunlight or tanning lamps while the skin adhesive is in place. Exposure to ultraviolet light in the first year will darken the scar.  The skin adhesive will usually remain in place for 5 to 10 days, then naturally fall off the skin. Do not pick at the adhesive film. You may need a tetanus shot if:  You cannot remember when you had your last tetanus shot.  You have never had a tetanus shot. If you get a tetanus shot, your arm may swell, get red, and feel warm to the touch. This is common and not a problem. If you need a tetanus shot and you choose not to have one, there is a rare chance of getting tetanus. Sickness from tetanus can be serious. SEEK MEDICAL CARE IF:   You have redness, swelling, or increasing pain in the wound.  You see a red line that goes away from the wound.  You have yellowish-white fluid (pus) coming from the wound.  You have a fever.  You notice a bad smell coming  from the wound or dressing.  Your wound breaks open before or after sutures have been removed.  You notice something coming out of the wound such as wood or glass.  Your wound is on your hand or foot and you cannot move a finger or toe. SEEK IMMEDIATE MEDICAL CARE IF:   Your pain is not controlled with prescribed medicine.  You have severe swelling around the wound causing pain and numbness or a change in color in your arm, hand, leg, or foot.  Your wound splits open and starts bleeding.  You have worsening numbness, weakness, or loss of function of any joint around or beyond the wound.  You develop painful lumps near the wound or on the skin anywhere on your body. MAKE SURE YOU:   Understand these instructions.  Will watch your condition.  Will get help right away if you are not doing well or get worse. Document Released: 08/11/2005 Document Revised: 11/03/2011 Document Reviewed: 02/04/2011 [facility] Patient Information 2014 [place], [place]. Tissue Adhesive Wound Care Some cuts, wounds, lacerations, and incisions can be repaired by using tissue adhesive. Tissue adhesive is like glue. It holds the skin together, allowing for faster healing. It forms a strong bond on the skin in about 1 minute and reaches its full strength in about 2 or 3 minutes. The adhesive disappears naturally while the wound is healing. It is important to take proper care of your wound at home while it heals.  HOME CARE INSTRUCTIONS   Showers are allowed. Do not soak the area containing the tissue adhesive. Do not take baths, swim, or use hot tubs. Do not use any soaps or ointments on the wound. Certain ointments can weaken the glue.  If a bandage (dressing) has been applied, follow your health care provider's instructions for how often to change the dressing.   Keep the dressing dry if one has been applied.   Do not scratch, pick, or rub the adhesive.   Do not place tape over the adhesive. The adhesive  could come off when pulling the tape off.   Protect the wound from further injury until it is healed.   Protect the wound from sun and tanning bed exposure while it is healing and for several weeks after healing.   Only take over-the-counter or prescription medicines as directed by your health care provider.   Keep all follow-up appointments as directed by your health care provider. SEEK IMMEDIATE MEDICAL CARE IF:  Your wound becomes red, swollen, hot, or tender.   You develop a rash after the glue is applied.  You have increasing pain in the wound.   You have a red streak that goes away from the wound.   You have pus coming from the wound.   You have increased bleeding.  You have a fever.  You have shaking chills.   You notice a bad smell coming from the wound.   Your wound or adhesive breaks open.  MAKE SURE YOU:   Understand these instructions.  Will watch your condition.  Will get help right away if you are not doing well or get worse. Document Released: 02/04/2001 Document Revised: 06/01/2013 Document Reviewed: 03/02/2013 [facility] Patient Information 2014 [place], [place].

## 2014-11-15 ENCOUNTER — Encounter (HOSPITAL_COMMUNITY): Payer: Self-pay | Admitting: Emergency Medicine

## 2014-11-15 ENCOUNTER — Emergency Department (HOSPITAL_COMMUNITY): Payer: Medicare Other

## 2014-11-15 ENCOUNTER — Inpatient Hospital Stay (HOSPITAL_COMMUNITY)
Admission: EM | Admit: 2014-11-15 | Discharge: 2014-11-19 | DRG: 690 | Disposition: A | Discharge status: Skilled Nursing Facility | Payer: Medicare Other | Attending: Family Medicine | Admitting: Family Medicine

## 2014-11-15 DIAGNOSIS — E871 Hypo-osmolality and hyponatremia: Secondary | ICD-10-CM | POA: Diagnosis present

## 2014-11-15 DIAGNOSIS — D509 Iron deficiency anemia, unspecified: Secondary | ICD-10-CM | POA: Diagnosis present

## 2014-11-15 DIAGNOSIS — S60221A Contusion of right hand, initial encounter: Secondary | ICD-10-CM | POA: Diagnosis present

## 2014-11-15 DIAGNOSIS — E876 Hypokalemia: Secondary | ICD-10-CM | POA: Diagnosis present

## 2014-11-15 DIAGNOSIS — N289 Disorder of kidney and ureter, unspecified: Secondary | ICD-10-CM

## 2014-11-15 DIAGNOSIS — M810 Age-related osteoporosis without current pathological fracture: Secondary | ICD-10-CM | POA: Diagnosis present

## 2014-11-15 DIAGNOSIS — N182 Chronic kidney disease, stage 2 (mild): Secondary | ICD-10-CM | POA: Diagnosis present

## 2014-11-15 DIAGNOSIS — S2241XA Multiple fractures of ribs, right side, initial encounter for closed fracture: Secondary | ICD-10-CM | POA: Diagnosis present

## 2014-11-15 DIAGNOSIS — W19XXXA Unspecified fall, initial encounter: Secondary | ICD-10-CM

## 2014-11-15 DIAGNOSIS — Z7982 Long term (current) use of aspirin: Secondary | ICD-10-CM

## 2014-11-15 DIAGNOSIS — E780 Pure hypercholesterolemia: Secondary | ICD-10-CM | POA: Diagnosis present

## 2014-11-15 DIAGNOSIS — S2231XA Fracture of one rib, right side, initial encounter for closed fracture: Secondary | ICD-10-CM

## 2014-11-15 DIAGNOSIS — B9689 Other specified bacterial agents as the cause of diseases classified elsewhere: Secondary | ICD-10-CM | POA: Diagnosis present

## 2014-11-15 DIAGNOSIS — M7989 Other specified soft tissue disorders: Secondary | ICD-10-CM

## 2014-11-15 DIAGNOSIS — W06XXXA Fall from bed, initial encounter: Secondary | ICD-10-CM | POA: Diagnosis present

## 2014-11-15 DIAGNOSIS — S2239XA Fracture of one rib, unspecified side, initial encounter for closed fracture: Secondary | ICD-10-CM | POA: Diagnosis present

## 2014-11-15 DIAGNOSIS — E785 Hyperlipidemia, unspecified: Secondary | ICD-10-CM | POA: Diagnosis present

## 2014-11-15 DIAGNOSIS — Y92009 Unspecified place in unspecified non-institutional (private) residence as the place of occurrence of the external cause: Secondary | ICD-10-CM

## 2014-11-15 DIAGNOSIS — N390 Urinary tract infection, site not specified: Secondary | ICD-10-CM | POA: Diagnosis not present

## 2014-11-15 DIAGNOSIS — S61411A Laceration without foreign body of right hand, initial encounter: Secondary | ICD-10-CM | POA: Diagnosis present

## 2014-11-15 DIAGNOSIS — S8001XA Contusion of right knee, initial encounter: Secondary | ICD-10-CM | POA: Diagnosis present

## 2014-11-15 HISTORY — DX: Tobacco use: Z72.0

## 2014-11-15 HISTORY — DX: Pure hypercholesterolemia, unspecified: E78.00

## 2014-11-15 LAB — BASIC METABOLIC PANEL
Anion gap: 6 (ref 5–15)
BUN: 35 mg/dL — ABNORMAL HIGH (ref 6–23)
CALCIUM: 7.9 mg/dL — AB (ref 8.4–10.5)
CO2: 27 mmol/L (ref 19–32)
CREATININE: 2.16 mg/dL — AB (ref 0.50–1.35)
Chloride: 100 mmol/L (ref 96–112)
GFR calc Af Amer: 27 mL/min — ABNORMAL LOW (ref >90)
GFR calc non Af Amer: 23 mL/min — ABNORMAL LOW (ref >90)
GLUCOSE: 85 mg/dL (ref 70–99)
Potassium: 3.4 mmol/L — ABNORMAL LOW (ref 3.5–5.1)
Sodium: 133 mmol/L — ABNORMAL LOW (ref 135–145)

## 2014-11-15 LAB — URINALYSIS, ROUTINE W REFLEX MICROSCOPIC
Bilirubin Urine: NEGATIVE
GLUCOSE, UA: NEGATIVE mg/dL
KETONES UR: NEGATIVE mg/dL
NITRITE: NEGATIVE
PH: 7 (ref 5.0–8.0)
Protein, ur: 30 mg/dL — AB
SPECIFIC GRAVITY, URINE: 1.015 (ref 1.005–1.030)
Urobilinogen, UA: 0.2 mg/dL (ref 0.0–1.0)

## 2014-11-15 LAB — CBC WITH DIFFERENTIAL/PLATELET
BASOS ABS: 0 10*3/uL (ref 0.0–0.1)
BASOS PCT: 0 % (ref 0–1)
EOS PCT: 1 % (ref 0–5)
Eosinophils Absolute: 0.1 10*3/uL (ref 0.0–0.7)
HCT: 35.9 % — ABNORMAL LOW (ref 39.0–52.0)
Hemoglobin: 11.7 g/dL — ABNORMAL LOW (ref 13.0–17.0)
Lymphocytes Relative: 23 % (ref 12–46)
Lymphs Abs: 1.2 10*3/uL (ref 0.7–4.0)
MCH: 33.7 pg (ref 26.0–34.0)
MCHC: 32.6 g/dL (ref 30.0–36.0)
MCV: 103.5 fL — AB (ref 78.0–100.0)
MONO ABS: 0.5 10*3/uL (ref 0.1–1.0)
MONOS PCT: 9 % (ref 3–12)
NEUTROS ABS: 3.5 10*3/uL (ref 1.7–7.7)
Neutrophils Relative %: 67 % (ref 43–77)
PLATELETS: 167 10*3/uL (ref 150–400)
RBC: 3.47 MIL/uL — AB (ref 4.22–5.81)
RDW: 13.7 % (ref 11.5–15.5)
WBC: 5.2 10*3/uL (ref 4.0–10.5)

## 2014-11-15 LAB — URINE MICROSCOPIC-ADD ON

## 2014-11-15 MED ORDER — TRAMADOL HCL 50 MG PO TABS
50.0000 mg | ORAL_TABLET | Freq: Once | ORAL | Status: AC
  Administered 2014-11-15: 50 mg via ORAL
  Filled 2014-11-15: qty 1

## 2014-11-15 MED ORDER — BACITRACIN ZINC 500 UNIT/GM EX OINT
TOPICAL_OINTMENT | CUTANEOUS | Status: AC
  Filled 2014-11-15: qty 1.8

## 2014-11-15 MED ORDER — CEPHALEXIN 500 MG PO CAPS
500.0000 mg | ORAL_CAPSULE | Freq: Once | ORAL | Status: DC
  Filled 2014-11-15: qty 1

## 2014-11-15 MED ORDER — POTASSIUM CHLORIDE CRYS ER 20 MEQ PO TBCR
40.0000 meq | EXTENDED_RELEASE_TABLET | Freq: Once | ORAL | Status: AC
  Administered 2014-11-16: 40 meq via ORAL
  Filled 2014-11-15: qty 2

## 2014-11-15 NOTE — ED Notes (Signed)
Patient also has very foul smell to urine and is complaining of having to urinate frequently.

## 2014-11-15 NOTE — ED Notes (Addendum)
Patient fell yesterday and landed on wheelchair and then to floor. Patient complaining of pain to right knee and right ribs per family. Skin tear to right hand.

## 2014-11-15 NOTE — ED Provider Notes (Signed)
CSN: [id number]     Arrival date & time 11/15/14  2001 History  This chart was scribed for Richard Booze, MD by Abel Presto, ED Scribe. This patient was seen in room APA06/APA06 and the patient's care was started at 8:12 PM.      Chief Complaint  Patient presents with  . Fall   LEVEL 5 CAVEAT- DEMENTIA  Patient is a 81 y.o. male presenting with fall. The history is provided by the patient. The history is limited by the condition of the patient. No language interpreter was used.  Fall   HPI Comments: [name] is a 81 y.o. male with PMHx of HCL who presents to the Emergency Department complaining of fall yesterday. Per nursing note pt fell from wheelchair onto the floor. Pt presenting with assocaited skin tear and bruising to right hand, pain to right ribs, and right knee pain. Pt indicating frequent urgency with minimal output in exam room, unsure is this is baseline for him. Pt A&Ox2.   Past Medical History  Diagnosis Date  . Hypercholesteremia    History reviewed. No pertinent past surgical history. History reviewed. No pertinent family history. History  Substance Use Topics  . Smoking status: Unknown If Ever Smoked  . Smokeless tobacco: Not on file  . Alcohol Use: No    Review of Systems  Unable to perform ROS: Dementia      Allergies  Review of patient's allergies indicates no known allergies.  Home Medications   Prior to Admission medications   Not on File   BP 128/59 mmHg  Pulse 60  Resp 16  Ht 6' (1.829 m)  Wt 175 lb (79.379 kg)  BMI 23.73 kg/m2  SpO2 97% Physical Exam  Constitutional: He appears well-developed and well-nourished.  HENT:  Head: Normocephalic.  Eyes: Conjunctivae are normal.  Neck: Normal range of motion. Neck supple.  Cardiovascular: Normal rate, regular rhythm and normal heart sounds.   Pulmonary/Chest: Effort normal and breath sounds normal. No respiratory distress. He exhibits tenderness (right lateral ribcage).   Musculoskeletal: Normal range of motion. He exhibits edema (2+ pedal edema).       Right hand: He exhibits no deformity and no swelling.  ecchymossis and skin tears to right hand  Neurological: He is alert.  Oriented to person and place but not time  Skin: Skin is warm and dry.  Psychiatric: He has a normal mood and affect. His behavior is normal.  Nursing note and vitals reviewed.   ED Course  Procedures (including critical care time) DIAGNOSTIC STUDIES: Oxygen Saturation is 97% on room air, normal by my interpretation.    COORDINATION OF CARE: 8:22 PM Discussed treatment plan with patient at beside, the patient agrees with the plan and has no further questions at this time.   Labs Review Results for orders placed or performed during the hospital encounter of 11/15/14  Urinalysis, Routine w reflex microscopic  Result Value Ref Range   Color, Urine YELLOW YELLOW   APPearance HAZY (A) CLEAR   Specific Gravity, Urine 1.015 1.005 - 1.030   pH 7.0 5.0 - 8.0   Glucose, UA NEGATIVE NEGATIVE mg/dL   Hgb urine dipstick SMALL (A) NEGATIVE   Bilirubin Urine NEGATIVE NEGATIVE   Ketones, ur NEGATIVE NEGATIVE mg/dL   Protein, ur 30 (A) NEGATIVE mg/dL   Urobilinogen, UA 0.2 0.0 - 1.0 mg/dL   Nitrite NEGATIVE NEGATIVE   Leukocytes, UA LARGE (A) NEGATIVE  CBC with Differential  Result Value Ref Range  WBC 5.2 4.0 - 10.5 K/uL   RBC 3.47 (L) 4.22 - 5.81 MIL/uL   Hemoglobin 11.7 (L) 13.0 - 17.0 g/dL   HCT 16.135.9 (L) 09.639.0 - 04.552.0 %   MCV 103.5 (H) 78.0 - 100.0 fL   MCH 33.7 26.0 - 34.0 pg   MCHC 32.6 30.0 - 36.0 g/dL   RDW 40.913.7 81.111.5 - 91.415.5 %   Platelets 167 150 - 400 K/uL   Neutrophils Relative % 67 43 - 77 %   Neutro Abs 3.5 1.7 - 7.7 K/uL   Lymphocytes Relative 23 12 - 46 %   Lymphs Abs 1.2 0.7 - 4.0 K/uL   Monocytes Relative 9 3 - 12 %   Monocytes Absolute 0.5 0.1 - 1.0 K/uL   Eosinophils Relative 1 0 - 5 %   Eosinophils Absolute 0.1 0.0 - 0.7 K/uL   Basophils Relative 0 0 - 1 %    Basophils Absolute 0.0 0.0 - 0.1 K/uL  Basic metabolic panel  Result Value Ref Range   Sodium 133 (L) 135 - 145 mmol/L   Potassium 3.4 (L) 3.5 - 5.1 mmol/L   Chloride 100 96 - 112 mmol/L   CO2 27 19 - 32 mmol/L   Glucose, Bld 85 70 - 99 mg/dL   BUN 35 (H) 6 - 23 mg/dL   Creatinine, Ser 7.822.16 (H) 0.50 - 1.35 mg/dL   Calcium 7.9 (L) 8.4 - 10.5 mg/dL   GFR calc non Af Amer 23 (L) >90 mL/min   GFR calc Af Amer 27 (L) >90 mL/min   Anion gap 6 5 - 15  Urine microscopic-add on  Result Value Ref Range   Squamous Epithelial / LPF RARE RARE   WBC, UA 21-50 <3 WBC/hpf   RBC / HPF 3-6 <3 RBC/hpf   Bacteria, UA MANY (A) RARE   Imaging Review Dg Ribs Unilateral W/chest Right  11/15/2014   CLINICAL DATA:  Status post fall from wheelchair onto floor. Right anterior and lateral rib pain. Initial encounter.  EXAM: RIGHT RIBS AND CHEST - 3+ VIEW  COMPARISON:  Chest radiograph performed 03/04/2010  FINDINGS: Fractures through the right anterolateral fifth through eighth ribs are suspected, though some of these may be chronic in nature.  Patchy bilateral atelectasis is noted, with underlying scarring. Increased interstitial markings are likely transient in nature. No definite pleural effusion or pneumothorax is seen.  The cardiomediastinal silhouette is enlarged. The patient is status post vertebroplasty at multiple levels along the thoracic spine.  IMPRESSION: 1. Fractures through the right anterolateral fifth through eighth ribs suspected, though some of these may be chronic in nature. 2. Patchy bilateral atelectasis, with underlying scarring. Increased interstitial markings are likely transient in nature. 3. Cardiomegaly noted.   Electronically Signed   By: Roanna RaiderJeffery  Chang M.D.   On: 11/15/2014 21:38   Ct Head Wo Contrast  11/15/2014   CLINICAL DATA:  Fall from wheelchair yesterday, dementia  EXAM: CT HEAD WITHOUT CONTRAST  CT CERVICAL SPINE WITHOUT CONTRAST  TECHNIQUE: Multidetector CT imaging of the head  and cervical spine was performed following the standard protocol without intravenous contrast. Multiplanar CT image reconstructions of the cervical spine were also generated.  COMPARISON:  11/20/2013  FINDINGS: CT HEAD FINDINGS  No skull fracture is noted. Paranasal sinuses and mastoid air cells are unremarkable. Atherosclerotic calcifications of carotid siphon are again noted. No intracranial hemorrhage, mass effect or midline shift. Moderate cerebral atrophy. Extensive periventricular and subcortical white matter decreased attenuation consistent with chronic small vessel  ischemic changes again noted.  CT CERVICAL SPINE FINDINGS  Axial images of the cervical spine shows no acute fracture or subluxation. There is diffuse osteopenia. There is no pneumothorax in visualized lung apices. Computer processed images shows no acute fracture or subluxation. Degenerative changes are noted C1-C2 articulation. There is mild disc space flattening with minimal posterior spurring at C3-C4 level. Mild disc space flattening at C4-C5 and C5-C6 level. Multilevel facet degenerative changes are noted. Mild anterior spurring lower endplate of C6 vertebral body. There is probable chronic mild compression deformity upper endplate of T1 vertebral body. No definite evidence of acute fracture.  No prevertebral soft tissue swelling.  Cervical airway is patent.  IMPRESSION: 1. No acute intracranial abnormality. Moderate cerebral atrophy. Extensive chronic white matter disease. No definite acute cortical infarction. 2. No cervical spine acute fracture or subluxation. Diffuse osteopenia. Multilevel degenerative changes as described above. Probable chronic mild compression deformity upper endplate of T1 vertebral body.   Electronically Signed   By: Natasha Mead M.D.   On: 11/15/2014 21:50   Ct Cervical Spine Wo Contrast  11/15/2014   CLINICAL DATA:  Fall from wheelchair yesterday, dementia  EXAM: CT HEAD WITHOUT CONTRAST  CT CERVICAL SPINE  WITHOUT CONTRAST  TECHNIQUE: Multidetector CT imaging of the head and cervical spine was performed following the standard protocol without intravenous contrast. Multiplanar CT image reconstructions of the cervical spine were also generated.  COMPARISON:  11/20/2013  FINDINGS: CT HEAD FINDINGS  No skull fracture is noted. Paranasal sinuses and mastoid air cells are unremarkable. Atherosclerotic calcifications of carotid siphon are again noted. No intracranial hemorrhage, mass effect or midline shift. Moderate cerebral atrophy. Extensive periventricular and subcortical white matter decreased attenuation consistent with chronic small vessel ischemic changes again noted.  CT CERVICAL SPINE FINDINGS  Axial images of the cervical spine shows no acute fracture or subluxation. There is diffuse osteopenia. There is no pneumothorax in visualized lung apices. Computer processed images shows no acute fracture or subluxation. Degenerative changes are noted C1-C2 articulation. There is mild disc space flattening with minimal posterior spurring at C3-C4 level. Mild disc space flattening at C4-C5 and C5-C6 level. Multilevel facet degenerative changes are noted. Mild anterior spurring lower endplate of C6 vertebral body. There is probable chronic mild compression deformity upper endplate of T1 vertebral body. No definite evidence of acute fracture.  No prevertebral soft tissue swelling.  Cervical airway is patent.  IMPRESSION: 1. No acute intracranial abnormality. Moderate cerebral atrophy. Extensive chronic white matter disease. No definite acute cortical infarction. 2. No cervical spine acute fracture or subluxation. Diffuse osteopenia. Multilevel degenerative changes as described above. Probable chronic mild compression deformity upper endplate of T1 vertebral body.   Electronically Signed   By: Natasha Mead M.D.   On: 11/15/2014 21:50   Dg Knee Complete 4 Views Left  11/16/2014   CLINICAL DATA:  One 81 year old male post  fall at home. Initial encounter.  EXAM: LEFT KNEE - COMPLETE 4+ VIEW  COMPARISON:  None.  FINDINGS: The bones are under mineralized. There is medial and lateral tibial femoral joint space narrowing and chondrocalcinosis. Mild spurring of the patella. No acute fracture. No joint effusion. Vascular calcifications are seen.  IMPRESSION: Degenerative change of the left knee without acute fracture.   Electronically Signed   By: Rubye Oaks M.D.   On: 11/16/2014 01:06   Dg Knee Complete 4 Views Right  11/15/2014   CLINICAL DATA:  Larey Seat from wheelchair to floor, with right knee pain.  Initial encounter.  EXAM: RIGHT KNEE - COMPLETE 4+ VIEW  COMPARISON:  Right knee radiographs performed 03/14/2009  FINDINGS: There is no evidence of fracture or dislocation. The joint spaces are preserved. Small marginal osteophytes are seen at the lateral and patellofemoral compartments.  Trace knee joint fluid remains within normal limits. Scattered vascular calcifications are seen.  IMPRESSION: 1. No evidence of fracture or dislocation. 2. Minimal osteoarthritis at the right knee. 3. Scattered vascular calcifications seen.   Electronically Signed   By: Roanna Raider M.D.   On: 11/15/2014 22:19   Dg Hand Complete Right  11/15/2014   CLINICAL DATA:  Larey Seat from wheelchair onto floor, with right hand pain and skin tear between the thumb and index finger. Initial encounter.  EXAM: RIGHT HAND - COMPLETE 3+ VIEW  COMPARISON:  None.  FINDINGS: The known soft tissue laceration is difficult to fully characterize on radiograph. No radiopaque foreign bodies are seen.  There is diffuse osteopenia of visualized osseous structures. Degenerative change is noted at the first carpometacarpal joint, and mild degenerative change is noted at the proximal carpal row. Calcification of the triangular fibrocartilage is noted. There is no evidence of fracture or dislocation.  IMPRESSION: 1. No radiopaque foreign bodies seen. 2. Diffuse osteopenia of  visualized osseous structures. 3. No evidence of fracture or dislocation. 4. Osteoarthritis of the right hand, with calcification of the triangular fibrocartilage.   Electronically Signed   By: Roanna Raider M.D.   On: 11/15/2014 22:12   Dg Hip Unilat With Pelvis 2-3 Views Left  11/16/2014   CLINICAL DATA:  One 81 year old male post fall at home, initial encounter.  EXAM: LEFT HIP (WITH PELVIS) 2-3 VIEWS  COMPARISON:  None.  FINDINGS: The bones are significantly under mineralized. Postsurgical change with lateral plate and screw fixation of the right hip. The cortical margins of the bony pelvis and left hip are intact. No displaced fracture. Pubic symphysis and sacroiliac joints are congruent. Both femoral heads are well-seated in the respective acetabula.  IMPRESSION: No displaced left hip fracture. The bones are significantly under mineralized. If there is high clinical suspicion for occult hip fracture or the patient refuses to weightbear, consider further evaluation with MRI. Although CT is expeditious, evidence is lacking regarding accuracy of CT over plain film radiography.   Electronically Signed   By: Rubye Oaks M.D.   On: 11/16/2014 01:05   Images viewed by me.   EKG Interpretation   Date/Time:  Wednesday November 15 2014 20:12:35 EDT Ventricular Rate:  50 PR Interval:    QRS Duration: 153 QT Interval:  554 QTC Calculation: 505 R Axis:   -57 Text Interpretation:  Atrial fibrillation Multiple ventricular premature  complexes RBBB and LAFB When compared with ECG of 07/05/2010, HEART RATE  has decreased Confirmed by [facility]  MD, Beckham Capistran ([id number]) on 11/15/2014 8:18:01  PM      MDM   Final diagnoses:  Fall at home, initial encounter  Fracture, ribs, right, closed, initial encounter  Contusion of right hand, initial encounter  Contusion of right knee, initial encounter  Renal insufficiency  Urinary tract infection without hematuria, site unspecified    Fall with right rib  cage injury. He is sent for x-rays. Also seems to be minor injury to his right hand and knee and does her x-ray as well. CT is obtained of head and cervical spine. X-rays show evidence of rib fractures but no other acute injury. Family has arrived and now state that he has  not been using his left leg since the fall. They state that he actually fell out of bed and he is normally not ambulatory. He has normal range of motion of the left hip and knee but will be sent for additional x-rays. Urinalysis does show evidence of urinary tract infection and he is started on cephalexin. Renal insufficiency is noted with 2 slightly worse than last value but last value was 4 years ago. Mild anemia is also noted as well as mild hyponatremia and hypokalemia. He is given a dose of oral potassium.  Additional x-rays showed no fractures. Family relates that he lives alone and has been transferring himself from bed to wheelchair. He is not likely to be able to accomplish this with the rib fractures. He is going to need short-term care in a skilled nursing facility. Case is discussed with Dr. Alvester Morin of [facility] who agrees to come and evaluate the patient for possible admission.   I personally performed the services described in this documentation, which was scribed in my presence. The recorded information has been reviewed and is accurate.       Richard Booze, MD 11/16/14 [phone]

## 2014-11-16 ENCOUNTER — Observation Stay (HOSPITAL_COMMUNITY): Payer: Medicare Other

## 2014-11-16 ENCOUNTER — Encounter (HOSPITAL_COMMUNITY): Payer: Self-pay | Admitting: *Deleted

## 2014-11-16 DIAGNOSIS — E876 Hypokalemia: Secondary | ICD-10-CM | POA: Diagnosis present

## 2014-11-16 DIAGNOSIS — W06XXXA Fall from bed, initial encounter: Secondary | ICD-10-CM | POA: Diagnosis present

## 2014-11-16 DIAGNOSIS — S8001XA Contusion of right knee, initial encounter: Secondary | ICD-10-CM | POA: Diagnosis present

## 2014-11-16 DIAGNOSIS — N390 Urinary tract infection, site not specified: Secondary | ICD-10-CM | POA: Diagnosis present

## 2014-11-16 DIAGNOSIS — W19XXXA Unspecified fall, initial encounter: Secondary | ICD-10-CM

## 2014-11-16 DIAGNOSIS — M7989 Other specified soft tissue disorders: Secondary | ICD-10-CM | POA: Diagnosis not present

## 2014-11-16 DIAGNOSIS — D509 Iron deficiency anemia, unspecified: Secondary | ICD-10-CM | POA: Diagnosis present

## 2014-11-16 DIAGNOSIS — B9689 Other specified bacterial agents as the cause of diseases classified elsewhere: Secondary | ICD-10-CM | POA: Diagnosis present

## 2014-11-16 DIAGNOSIS — S60221A Contusion of right hand, initial encounter: Secondary | ICD-10-CM | POA: Diagnosis present

## 2014-11-16 DIAGNOSIS — E780 Pure hypercholesterolemia: Secondary | ICD-10-CM | POA: Diagnosis present

## 2014-11-16 DIAGNOSIS — N182 Chronic kidney disease, stage 2 (mild): Secondary | ICD-10-CM | POA: Diagnosis present

## 2014-11-16 DIAGNOSIS — S2241XA Multiple fractures of ribs, right side, initial encounter for closed fracture: Secondary | ICD-10-CM | POA: Diagnosis present

## 2014-11-16 DIAGNOSIS — S2239XA Fracture of one rib, unspecified side, initial encounter for closed fracture: Secondary | ICD-10-CM | POA: Diagnosis present

## 2014-11-16 DIAGNOSIS — E785 Hyperlipidemia, unspecified: Secondary | ICD-10-CM | POA: Diagnosis present

## 2014-11-16 DIAGNOSIS — Y92009 Unspecified place in unspecified non-institutional (private) residence as the place of occurrence of the external cause: Secondary | ICD-10-CM | POA: Diagnosis not present

## 2014-11-16 DIAGNOSIS — Z7982 Long term (current) use of aspirin: Secondary | ICD-10-CM | POA: Diagnosis not present

## 2014-11-16 DIAGNOSIS — S61411A Laceration without foreign body of right hand, initial encounter: Secondary | ICD-10-CM | POA: Diagnosis present

## 2014-11-16 DIAGNOSIS — M810 Age-related osteoporosis without current pathological fracture: Secondary | ICD-10-CM | POA: Diagnosis present

## 2014-11-16 DIAGNOSIS — E871 Hypo-osmolality and hyponatremia: Secondary | ICD-10-CM | POA: Diagnosis present

## 2014-11-16 LAB — CBC WITH DIFFERENTIAL/PLATELET
Basophils Absolute: 0 10*3/uL (ref 0.0–0.1)
Basophils Relative: 0 % (ref 0–1)
EOS ABS: 0.1 10*3/uL (ref 0.0–0.7)
EOS PCT: 2 % (ref 0–5)
HCT: 33.5 % — ABNORMAL LOW (ref 39.0–52.0)
Hemoglobin: 11 g/dL — ABNORMAL LOW (ref 13.0–17.0)
Lymphocytes Relative: 28 % (ref 12–46)
Lymphs Abs: 1.3 10*3/uL (ref 0.7–4.0)
MCH: 33.8 pg (ref 26.0–34.0)
MCHC: 32.8 g/dL (ref 30.0–36.0)
MCV: 103.1 fL — ABNORMAL HIGH (ref 78.0–100.0)
MONO ABS: 0.5 10*3/uL (ref 0.1–1.0)
Monocytes Relative: 10 % (ref 3–12)
Neutro Abs: 2.7 10*3/uL (ref 1.7–7.7)
Neutrophils Relative %: 60 % (ref 43–77)
PLATELETS: 146 10*3/uL — AB (ref 150–400)
RBC: 3.25 MIL/uL — ABNORMAL LOW (ref 4.22–5.81)
RDW: 13.7 % (ref 11.5–15.5)
WBC: 4.6 10*3/uL (ref 4.0–10.5)

## 2014-11-16 LAB — COMPREHENSIVE METABOLIC PANEL
ALK PHOS: 82 U/L (ref 39–117)
ALT: 9 U/L (ref 0–53)
AST: 16 U/L (ref 0–37)
Albumin: 2.6 g/dL — ABNORMAL LOW (ref 3.5–5.2)
Anion gap: 5 (ref 5–15)
BUN: 36 mg/dL — AB (ref 6–23)
CO2: 26 mmol/L (ref 19–32)
Calcium: 7.7 mg/dL — ABNORMAL LOW (ref 8.4–10.5)
Chloride: 105 mmol/L (ref 96–112)
Creatinine, Ser: 2.03 mg/dL — ABNORMAL HIGH (ref 0.50–1.35)
GFR calc Af Amer: 29 mL/min — ABNORMAL LOW (ref >90)
GFR calc non Af Amer: 25 mL/min — ABNORMAL LOW (ref >90)
Glucose, Bld: 82 mg/dL (ref 70–99)
Potassium: 4.4 mmol/L (ref 3.5–5.1)
Sodium: 136 mmol/L (ref 135–145)
Total Bilirubin: 0.9 mg/dL (ref 0.3–1.2)
Total Protein: 6.4 g/dL (ref 6.0–8.3)

## 2014-11-16 LAB — MAGNESIUM: MAGNESIUM: 2.3 mg/dL (ref 1.5–2.5)

## 2014-11-16 MED ORDER — SIMVASTATIN 20 MG PO TABS
20.0000 mg | ORAL_TABLET | Freq: Every day | ORAL | Status: DC
  Administered 2014-11-16 – 2014-11-19 (×4): 20 mg via ORAL
  Filled 2014-11-16 (×4): qty 1

## 2014-11-16 MED ORDER — POTASSIUM CHLORIDE IN NACL 20-0.9 MEQ/L-% IV SOLN
INTRAVENOUS | Status: DC
  Administered 2014-11-16 – 2014-11-19 (×7): via INTRAVENOUS

## 2014-11-16 MED ORDER — CEPHALEXIN 250 MG/5ML PO SUSR
500.0000 mg | Freq: Once | ORAL | Status: AC
  Administered 2014-11-16: 500 mg via ORAL
  Filled 2014-11-16: qty 20

## 2014-11-16 MED ORDER — MORPHINE SULFATE 2 MG/ML IJ SOLN: 2.0000 mg | INTRAMUSCULAR | Status: DC | PRN

## 2014-11-16 MED ORDER — ASPIRIN EC 81 MG PO TBEC
81.0000 mg | DELAYED_RELEASE_TABLET | Freq: Every day | ORAL | Status: DC
  Administered 2014-11-16 – 2014-11-19 (×4): 81 mg via ORAL
  Filled 2014-11-16 (×4): qty 1

## 2014-11-16 MED ORDER — HEPARIN SODIUM (PORCINE) 5000 UNIT/ML IJ SOLN
5000.0000 [IU] | Freq: Three times a day (TID) | INTRAMUSCULAR | Status: DC
  Administered 2014-11-16 – 2014-11-19 (×10): 5000 [IU] via SUBCUTANEOUS
  Filled 2014-11-16 (×10): qty 1

## 2014-11-16 MED ORDER — DEXTROSE 5 % IV SOLN
1.0000 g | INTRAVENOUS | Status: DC
  Administered 2014-11-16 – 2014-11-19 (×4): 1 g via INTRAVENOUS
  Filled 2014-11-16 (×5): qty 10

## 2014-11-16 MED ORDER — ENSURE ENLIVE PO LIQD
237.0000 mL | Freq: Two times a day (BID) | ORAL | Status: DC
  Administered 2014-11-16: 237 mL via ORAL

## 2014-11-16 NOTE — Care Management Note (Addendum)
    Page 1 of 1   11/17/2014     9:45:14 AM CARE MANAGEMENT NOTE 11/17/2014  Patient:  [name],[name]   Account Number:  [id number]  Date Initiated:  11/16/2014  Documentation initiated by:  Sharrie RothmanBLACKWELL,Shaila Gilchrest C  Subjective/Objective Assessment:   Pt admitted from home with UTI and rib fractures. Pt lives alone and has a son that lives about a mile away from pt. Pts son is very active in the care of the pt. Pt has a w/c that he uses at home.     Action/Plan:   PT is recommending SNF. CSW is aware. Will continue to follow for discharge planning needs.   Anticipated DC Date:  11/18/2014   Anticipated DC Plan:  SKILLED NURSING FACILITY  In-house referral  Clinical Social Worker      DC Planning Services  CM consult      Choice offered to / List presented to:             Status of service:  Completed, signed off Medicare Important Message given?  YES (If response is "NO", the following Medicare IM given date fields will be blank) Date Medicare IM given:  11/17/2014 Medicare IM given by:  Sharrie RothmanBLACKWELL,Rosalio Catterton C Date Additional Medicare IM given:   Additional Medicare IM given by:    Discharge Disposition:  SKILLED NURSING FACILITY  Per UR Regulation:    If discussed at Long Length of Stay Meetings, dates discussed:    Comments:  11/17/14 0940 Arlyss Queenammy Asuncion Tapscott, RN BSN CM Pt family [facility]. Anticipate discharge within 24-48 hours. Still receiving IV AB for UTi. CSW to arrange discharge to facility.  11/16/14 1245 Arlyss Queenammy Celicia Minahan, RN BSN CM

## 2014-11-16 NOTE — Progress Notes (Signed)
INITIAL NUTRITION ASSESSMENT  DOCUMENTATION CODES Per approved criteria  -Not Applicable   INTERVENTION: .-Ensure Enlive po BID, each supplement provides 350 kcal and 20 grams of protein - recommend monitor for coughing/signs of potential aspiration  -Recommend MVI  -Monitor oral intake and adjust supplements/snacks as warranted  NUTRITION DIAGNOSIS: Inadequate oral intake related to decreased appetite as evidenced by estimated to be eating <75% of needs.   Goal: Pt to meet >/= 90% of their estimated nutrition needs   Monitor:  Oral intake, signs of trouble swallowing, weight, labs  Reason for Assessment: MST  [age] y.o. male  Admitting Dx: <principal problem not specified>  ASSESSMENT: [age] y.o. year old male with significant past medical history of HLD, osteopenia/osteoporosis, chronic renal insufficiency presenting with fall, rib fx, UTI  Patient is very HOH and had difficult time communicating. Pt stated he usually eats 3 meals a day, but the last few days he has had a reduced appetite.   He does not know what his normal weight is.  He declined supps/snacks at this point saying he is not hungry  Will order ensure enlive over Magic cup BID. I think it will be easier for him to drink through a straw then use spoon-reccomend monitoring for coughing/signs of aspiration  Nutrition Focused Physical Exam: Pt disoriented to situation-N/a  Height: Ht Readings from Last 1 Encounters:  11/16/14 6' (1.829 m)    Weight: Wt Readings from Last 1 Encounters:  11/16/14 153 lb 1.6 oz (69.446 kg)    Ideal Body Weight: 178 lbs  % Ideal Body Weight: 86%  Wt Readings from Last 10 Encounters:  11/16/14 153 lb 1.6 oz (69.446 kg)  Unable to obtain wt hx  Usual Body Weight: Unknown  BMI:  Body mass index is 20.76 kg/(m^2).  Estimated Nutritional Needs: Kcal: 1600-1800 (23-26 kcals/kg) Protein: 90-104g Pro (1.3-1.5 g/kg) Fluid: 2.1 liters  Skin: Abrasions and  redness  Diet Order: Diet Heart  EDUCATION NEEDS: -No education needs identified at this time   Intake/Output Summary (Last 24 hours) at 11/16/14 1655 Last data filed at 11/16/14 1604  Gross per 24 hour  Intake    350 ml  Output    550 ml  Net   -200 ml    Last BM:  3/24  Labs:   Recent Labs Lab 11/15/14 2023 11/16/14 0643 11/16/14 0645  NA 133*  --  136  K 3.4*  --  4.4  CL 100  --  105  CO2 27  --  26  BUN 35*  --  36*  CREATININE 2.16*  --  2.03*  CALCIUM 7.9*  --  7.7*  MG  --  2.3  --   GLUCOSE 85  --  82    CBG (last 3)  No results for input(s): GLUCAP in the last 72 hours.  Scheduled Meds: . aspirin EC  81 mg Oral Daily  . cefTRIAXone (ROCEPHIN)  IV  1 g Intravenous Q24H  . feeding supplement (ENSURE ENLIVE)  237 mL Oral BID BM  . heparin  5,000 Units Subcutaneous 3 times per day  . simvastatin  20 mg Oral Daily    Continuous Infusions: . 0.9 % NaCl with KCl 20 mEq / L 125 mL/hr at 11/16/14 1202    Past Medical History  Diagnosis Date  . Hypercholesteremia   . Chewing tobacco use     History reviewed. No pertinent past surgical history.  Christophe LouisNathan Larrie Lucia RD, LDN Nutrition Pager: [phone] 11/16/2014 4:55 PM

## 2014-11-16 NOTE — Evaluation (Signed)
Physical Therapy Evaluation Patient Details Name: [name] MRN: [id number] DOB: 11/05/1911 Today's Date: 11/16/2014   History of Present Illness  History of Present Illness:This is a [age] y.o. year old male with significant past medical history of HLD, osteopenia/osteoporosis, chronic renal insufficiency presenting with fall, rib fx, UTI. Per the family, patient still lives alone at home. Still self ambulating intermittently. Family lives next-door and helps with his ADLs. Per report, patient fell out of bed. Patient subsequently produces right hand, right ribs and right knee. Family denies any history of falls in the past. No reports of fevers or chills. No reports of weakness. Appetite stable. Family does report patient with foul-smelling urine recently.Pt has a history of dementia and is unable to report much history.  He does state that he sometimes feels "swimmy headed" when leaning forward and he thinks that this is what has caused this recent fall.  Clinical Impression   Pt was seen for evaluation.  He was able to awaken and was cooperative, able to follow directions.  He had no pain at rest, right hand is bandaged.  RN states that an US of the LEs is planned for this morning to rule out DVT.  He is found to have significant weakness in both LEs with bilateral knee flexion contractures.  He is not trying to move in the bed spontaneously.  Because of upcoming US we did not attempt to mobilize OOB but I anticipate that he will need max to total assist due to limited use of UEs, significant LE weakness and 4 right rib fractures.  I am strongly recommending SNF at d/c.    Follow Up Recommendations SNF    Equipment Recommendations  None recommended by PT    Recommendations for Other Services    none   Precautions / Restrictions Precautions Precautions: Fall Precaution Comments: right fx ribs Restrictions Weight Bearing Restrictions: No      Mobility  Bed Mobility                General bed mobility comments: Unable to test due to rib fractures limiting mobility  Transfers                    Ambulation/Gait                Stairs            Wheelchair Mobility    Modified Rankin (Stroke Patients Only)       Balance Overall balance assessment:  (unable to assess)                                           Pertinent Vitals/Pain Pain Assessment: No/denies pain (at rest)    Home Living Family/patient expects to be discharged to:: Skilled nursing facility Living Arrangements: Alone                    Prior Function Level of Independence: Needs assistance   Gait / Transfers Assistance Needed: pt is not ambulatory, mobilizes with a w/c...he apparently transfers bed to w/c independently  ADL's / Homemaking Assistance Needed: it is assumed that pt needs full assist with all household activities and personal grooming        Hand Dominance   Dominant Hand: Right    Extremity/Trunk Assessment   Upper Extremity Assessment: Defer to OT evaluation  Lower Extremity Assessment: Generalized weakness (bilateral knee flexion contractures of 15-20 degrees)         Communication   Communication: Expressive difficulties;HOH (speech is difficult to understand)  Cognition Arousal/Alertness: Awake/alert Behavior During Therapy: WFL for tasks assessed/performed Overall Cognitive Status: History of cognitive impairments - at baseline                      General Comments      Exercises        Assessment/Plan    PT Assessment Patient needs continued PT services  PT Diagnosis Generalized weakness;Acute pain   PT Problem List Decreased strength;Decreased activity tolerance;Decreased mobility;Decreased cognition;Pain  PT Treatment Interventions Functional mobility training;Therapeutic exercise;Patient/family education   PT Goals (Current goals can be found in the Care Plan  section) Acute Rehab PT Goals Patient Stated Goal: none stated PT Goal Formulation: Patient unable to participate in goal setting Time For Goal Achievement: 11/30/14 Potential to Achieve Goals: Fair    Frequency Min 5X/week   Barriers to discharge Decreased caregiver support lives alone    Co-evaluation PT/OT/SLP Co-Evaluation/Treatment: Yes Reason for Co-Treatment: Complexity of the patient's impairments (multi-system involvement);For patient/therapist safety PT goals addressed during session: Strengthening/ROM;Mobility/safety with mobility         End of Session   Activity Tolerance: Patient limited by fatigue Patient left: in bed;with call bell/phone within reach;with bed alarm set Nurse Communication: Mobility status    Functional Assessment Tool Used: clinical judgement Functional Limitation: Mobility: Walking and moving around Mobility: Walking and Moving Around Current Status (Z6109): 100 percent impaired, limited or restricted Mobility: Walking and Moving Around Goal Status (U0454): At least 80 percent but less than 100 percent impaired, limited or restricted    Time: 0825-0849 PT Time Calculation (min) (ACUTE ONLY): 24 min   Charges:   PT Evaluation $Initial PT Evaluation Tier I: 1 Procedure     PT G Codes:   PT G-Codes **NOT FOR INPATIENT CLASS** Functional Assessment Tool Used: clinical judgement Functional Limitation: Mobility: Walking and moving around Mobility: Walking and Moving Around Current Status (U9811): 100 percent impaired, limited or restricted Mobility: Walking and Moving Around Goal Status (B1478): At least 80 percent but less than 100 percent impaired, limited or restricted    Konrad Penta 11/16/2014, 9:47 AM

## 2014-11-16 NOTE — Progress Notes (Signed)
Subjective: The patient is alert and oriented today. He is admitted following a fall at home he does have rib fractures ribs 5 through 8. He also has laceration of right hand. And urinary tract infection-chronic renal insufficiency  Objective: Vital signs in last 24 hours: Temp:  [97.4 F (36.3 C)-97.8 F (36.6 C)] 97.4 F (36.3 C) (03/24 0335) Pulse Rate:  [50-88] 53 (03/24 0335) Resp:  [13-20] 15 (03/24 0335) BP: (109-152)/(59-79) 114/59 mmHg (03/24 0335) SpO2:  [92 %-97 %] 97 % (03/24 0335) Weight:  [69.446 kg (153 lb 1.6 oz)-79.379 kg (175 lb)] 69.446 kg (153 lb 1.6 oz) (03/24 0335) Weight change:     Intake/Output from previous day:   Intake/Output this shift:    Physical Exam: Gen. appearance patient is alert and oriented  HEENT negative  Neck supple no JVD or thyroid abnormalities  Heart regular rhythm no murmurs  Lungs clear to P&A  Abdomen no palpable organs or masses  Extremities laceration of right hand  Skin normal  Neurological no focal abnormalities   Recent Labs  11/15/14 2029  WBC 5.2  HGB 11.7*  HCT 35.9*  PLT 167   BMET  Recent Labs  11/15/14 2023  NA 133*  K 3.4*  CL 100  CO2 27  GLUCOSE 85  BUN 35*  CREATININE 2.16*  CALCIUM 7.9*    Studies/Results: Dg Ribs Unilateral W/chest Right  11/15/2014   CLINICAL DATA:  Status post fall from wheelchair onto floor. Right anterior and lateral rib pain. Initial encounter.  EXAM: RIGHT RIBS AND CHEST - 3+ VIEW  COMPARISON:  Chest radiograph performed 03/04/2010  FINDINGS: Fractures through the right anterolateral fifth through eighth ribs are suspected, though some of these may be chronic in nature.  Patchy bilateral atelectasis is noted, with underlying scarring. Increased interstitial markings are likely transient in nature. No definite pleural effusion or pneumothorax is seen.  The cardiomediastinal silhouette is enlarged. The patient is status post vertebroplasty at multiple levels along  the thoracic spine.  IMPRESSION: 1. Fractures through the right anterolateral fifth through eighth ribs suspected, though some of these may be chronic in nature. 2. Patchy bilateral atelectasis, with underlying scarring. Increased interstitial markings are likely transient in nature. 3. Cardiomegaly noted.   Electronically Signed   By: Roanna Raider M.D.   On: 11/15/2014 21:38   Ct Head Wo Contrast  11/15/2014   CLINICAL DATA:  Fall from wheelchair yesterday, dementia  EXAM: CT HEAD WITHOUT CONTRAST  CT CERVICAL SPINE WITHOUT CONTRAST  TECHNIQUE: Multidetector CT imaging of the head and cervical spine was performed following the standard protocol without intravenous contrast. Multiplanar CT image reconstructions of the cervical spine were also generated.  COMPARISON:  11/20/2013  FINDINGS: CT HEAD FINDINGS  No skull fracture is noted. Paranasal sinuses and mastoid air cells are unremarkable. Atherosclerotic calcifications of carotid siphon are again noted. No intracranial hemorrhage, mass effect or midline shift. Moderate cerebral atrophy. Extensive periventricular and subcortical white matter decreased attenuation consistent with chronic small vessel ischemic changes again noted.  CT CERVICAL SPINE FINDINGS  Axial images of the cervical spine shows no acute fracture or subluxation. There is diffuse osteopenia. There is no pneumothorax in visualized lung apices. Computer processed images shows no acute fracture or subluxation. Degenerative changes are noted C1-C2 articulation. There is mild disc space flattening with minimal posterior spurring at C3-C4 level. Mild disc space flattening at C4-C5 and C5-C6 level. Multilevel facet degenerative changes are noted. Mild anterior spurring lower endplate of C6  vertebral body. There is probable chronic mild compression deformity upper endplate of T1 vertebral body. No definite evidence of acute fracture.  No prevertebral soft tissue swelling.  Cervical airway is patent.   IMPRESSION: 1. No acute intracranial abnormality. Moderate cerebral atrophy. Extensive chronic white matter disease. No definite acute cortical infarction. 2. No cervical spine acute fracture or subluxation. Diffuse osteopenia. Multilevel degenerative changes as described above. Probable chronic mild compression deformity upper endplate of T1 vertebral body.   Electronically Signed   By: Natasha Mead M.D.   On: 11/15/2014 21:50   Ct Cervical Spine Wo Contrast  11/15/2014   CLINICAL DATA:  Fall from wheelchair yesterday, dementia  EXAM: CT HEAD WITHOUT CONTRAST  CT CERVICAL SPINE WITHOUT CONTRAST  TECHNIQUE: Multidetector CT imaging of the head and cervical spine was performed following the standard protocol without intravenous contrast. Multiplanar CT image reconstructions of the cervical spine were also generated.  COMPARISON:  11/20/2013  FINDINGS: CT HEAD FINDINGS  No skull fracture is noted. Paranasal sinuses and mastoid air cells are unremarkable. Atherosclerotic calcifications of carotid siphon are again noted. No intracranial hemorrhage, mass effect or midline shift. Moderate cerebral atrophy. Extensive periventricular and subcortical white matter decreased attenuation consistent with chronic small vessel ischemic changes again noted.  CT CERVICAL SPINE FINDINGS  Axial images of the cervical spine shows no acute fracture or subluxation. There is diffuse osteopenia. There is no pneumothorax in visualized lung apices. Computer processed images shows no acute fracture or subluxation. Degenerative changes are noted C1-C2 articulation. There is mild disc space flattening with minimal posterior spurring at C3-C4 level. Mild disc space flattening at C4-C5 and C5-C6 level. Multilevel facet degenerative changes are noted. Mild anterior spurring lower endplate of C6 vertebral body. There is probable chronic mild compression deformity upper endplate of T1 vertebral body. No definite evidence of acute fracture.  No  prevertebral soft tissue swelling.  Cervical airway is patent.  IMPRESSION: 1. No acute intracranial abnormality. Moderate cerebral atrophy. Extensive chronic white matter disease. No definite acute cortical infarction. 2. No cervical spine acute fracture or subluxation. Diffuse osteopenia. Multilevel degenerative changes as described above. Probable chronic mild compression deformity upper endplate of T1 vertebral body.   Electronically Signed   By: Natasha Mead M.D.   On: 11/15/2014 21:50   Dg Knee Complete 4 Views Left  11/16/2014   CLINICAL DATA:  One 81 year old male post fall at home. Initial encounter.  EXAM: LEFT KNEE - COMPLETE 4+ VIEW  COMPARISON:  None.  FINDINGS: The bones are under mineralized. There is medial and lateral tibial femoral joint space narrowing and chondrocalcinosis. Mild spurring of the patella. No acute fracture. No joint effusion. Vascular calcifications are seen.  IMPRESSION: Degenerative change of the left knee without acute fracture.   Electronically Signed   By: Rubye Oaks M.D.   On: 11/16/2014 01:06   Dg Knee Complete 4 Views Right  11/15/2014   CLINICAL DATA:  Larey Seat from wheelchair to floor, with right knee pain. Initial encounter.  EXAM: RIGHT KNEE - COMPLETE 4+ VIEW  COMPARISON:  Right knee radiographs performed 03/14/2009  FINDINGS: There is no evidence of fracture or dislocation. The joint spaces are preserved. Small marginal osteophytes are seen at the lateral and patellofemoral compartments.  Trace knee joint fluid remains within normal limits. Scattered vascular calcifications are seen.  IMPRESSION: 1. No evidence of fracture or dislocation. 2. Minimal osteoarthritis at the right knee. 3. Scattered vascular calcifications seen.   Electronically Signed  By: Roanna RaiderJeffery  Chang M.D.   On: 11/15/2014 22:19   Dg Hand Complete Right  11/15/2014   CLINICAL DATA:  Larey SeatFell from wheelchair onto floor, with right hand pain and skin tear between the thumb and index finger.  Initial encounter.  EXAM: RIGHT HAND - COMPLETE 3+ VIEW  COMPARISON:  None.  FINDINGS: The known soft tissue laceration is difficult to fully characterize on radiograph. No radiopaque foreign bodies are seen.  There is diffuse osteopenia of visualized osseous structures. Degenerative change is noted at the first carpometacarpal joint, and mild degenerative change is noted at the proximal carpal row. Calcification of the triangular fibrocartilage is noted. There is no evidence of fracture or dislocation.  IMPRESSION: 1. No radiopaque foreign bodies seen. 2. Diffuse osteopenia of visualized osseous structures. 3. No evidence of fracture or dislocation. 4. Osteoarthritis of the right hand, with calcification of the triangular fibrocartilage.   Electronically Signed   By: Roanna RaiderJeffery  Chang M.D.   On: 11/15/2014 22:12   Dg Hip Unilat With Pelvis 2-3 Views Left  11/16/2014   CLINICAL DATA:  One 51hundred year old male post fall at home, initial encounter.  EXAM: LEFT HIP (WITH PELVIS) 2-3 VIEWS  COMPARISON:  None.  FINDINGS: The bones are significantly under mineralized. Postsurgical change with lateral plate and screw fixation of the right hip. The cortical margins of the bony pelvis and left hip are intact. No displaced fracture. Pubic symphysis and sacroiliac joints are congruent. Both femoral heads are well-seated in the respective acetabula.  IMPRESSION: No displaced left hip fracture. The bones are significantly under mineralized. If there is high clinical suspicion for occult hip fracture or the patient refuses to weightbear, consider further evaluation with MRI. Although CT is expeditious, evidence is lacking regarding accuracy of CT over plain film radiography.   Electronically Signed   By: Rubye OaksMelanie  Ehinger M.D.   On: 11/16/2014 01:05    Medications:  . aspirin EC  81 mg Oral Daily  . bacitracin      . cefTRIAXone (ROCEPHIN)  IV  1 g Intravenous Q24H  . heparin  5,000 Units Subcutaneous 3 times per day  .  simvastatin  20 mg Oral Daily    . 0.9 % NaCl with KCl 20 mEq / L 125 mL/hr at 11/16/14 0349     Assessment/Plan:  1. Rib fractures secondary to fall continue comfort measures and pain control  2. Urinary tract infection-plan continue IV Rocephin  3. CK D baseline  4. Hyponatremia-continue to monitor chemistries  The family would like to have patient placed in skilled nursing facility of possible.    Tomie Elko G 11/16/2014, 5:54 AM

## 2014-11-16 NOTE — Progress Notes (Signed)
Notified Dr Renard MatterMcinnis of PT/OT recommendation for speech consult for swallowing.  Order obtained for Speech evaluation.

## 2014-11-16 NOTE — Clinical Social Work Placement (Signed)
Clinical Social Work Department CLINICAL SOCIAL WORK PLACEMENT NOTE 11/16/2014  Patient:  Floy SabinaLLINGTON,Arvo J  Account Number:  [id number] Admit date:  11/15/2014  Clinical Social Worker:  Tretha SciaraHEATHER Akshat Minehart, LCSW  Date/time:  11/16/2014 04:21 PM  Clinical Social Work is seeking post-discharge placement for this patient at the following level of care:   SKILLED NURSING   (*CSW will update this form in Epic as items are completed)   11/16/2014  [facility]family provided with [facility] Clinical [facility] Work's list of facilities offering this level of care within the geographic area requested by the patient (or if unable, by the patient's family).  11/16/2014  Patient/family informed of their freedom to choose among providers that offer the needed level of care, that participate in Medicare, Medicaid or managed care program needed by the patient, have an available bed and are willing to accept the patient.  11/16/2014  Patient/family informed of [facility]' ownership interest in [facility], as well as of the fact that they are under no obligation to receive care at this facility.  PASARR submitted to EDS on 11/16/2014 PASARR number received on 11/16/2014  FL2 transmitted to all facilities in geographic area requested by pt/family on  11/16/2014 FL2 transmitted to all facilities within larger geographic area on   Patient informed that his/her managed care company has contracts with or will negotiate with  certain facilities, including the following:     Patient/family informed of bed offers received:   Patient chooses bed at  Physician recommends and patient chooses bed at    Patient to be transferred to  on   Patient to be transferred to facility by  Patient and family notified of transfer on  Name of family member notified:    The following physician request were entered in Epic:   Additional Comments:  Tretha SciaraHeather Grafton Warzecha, LCSW [phone]

## 2014-11-16 NOTE — Progress Notes (Signed)
UR completed 

## 2014-11-16 NOTE — Evaluation (Signed)
Clinical/Bedside Swallow Evaluation Patient Details  Name: [name] MRN: [id number] Date of Birth: [birth year]  Today's Date: 11/16/2014 Time: SLP Start Time (ACUTE ONLY): 1910 SLP Stop Time (ACUTE ONLY): 1950 SLP Time Calculation (min) (ACUTE ONLY): 40 min  Past Medical History:  Past Medical History  Diagnosis Date  . Hypercholesteremia   . Chewing tobacco use    Past Surgical History: History reviewed. No pertinent past surgical history. HPI:  This is a 81 y.o. year old male with significant past medical history of HLD, osteopenia/osteoporosis, chronic renal insufficiency presenting with fall, rib fx, UTI. Per the family, patient still lives alone at home. Still self ambulating intermittently. Family lives next-door and helps with his ADLs. Per report, patient fell out of bed. Patient subsequently produces right hand, right ribs and right knee. Family denies any history of falls in the past. No reports of fevers or chills. No reports of weakness. Appetite stable. Family does report patient with foul-smelling urine recently. Presented to the ER afebrile, hemodynamically stable. Hemoglobin 11.7, creatinine 2.16, sodium 133, potassium 3.4. Multiple imaging studies including hip x-ray, left and right knee x-ray, right hand x-ray, CT of the head and neck, right rib x-ray obtained. Most predominant finding was fractures to the right anterolateral fifth through eighth ribs suspected. Chronic degenerative changes diffusely and osteopenia changes. Family feels patient unsafe to go home. Chest x-ray shows: Fractures through the right anterolateral fifth through eighth ribs are suspected, though some of these may be chronic in nature. Patchy bilateral atelectasis is noted, with underlying scarring. Increased interstitial markings are likely transient in nature. No definite pleural effusion or pneumothorax is seen.SLP asked to evaluate swallow due to suspected aspiration with liquids.    Assessment  / Plan / Recommendation Clinical Impression  [name] is a pleasant 81 yo gentleman who was seen at bedside for a clinical swallow evaluation. Pt is hard of hearing, but conversant and comprehension adequate when given concise directions in a loud voice. Pt denies difficulty swallowing, but agreeable to examination. Dentition is in poor but adequate repair for age. Hyolaryngeal excursion is reduced upon palpation with volitional swallow. Pt with immediate to delayed cough after trials thin water. Suspect delay in swallow initiation and pharyngeal weakness necessitating multiple swallows to clear residuals. Pt cued to clear throat which resulted in improved vocal quality (mildly wet before). Pt demonstrated improved performance with decreased signs of penetration/aspiration when presented with nectar-thick liquids via cup and/or straw sips. Pt was able to masticate graham crackers, but benefitted from liquid wash and repetitive swallows. Recommend D2/chopped with nectar-thick liquids with 100% supervision and cues for pt to swallow 2-3x for each bite/sip. Present pills whole in puree and offer liquid wash (nectars). Results and recommendations reviewed with pt and pt agreeable to downgrade of diet, however he states that "you can't live without ice water". For now, no thin liquids including water. Recommend SNF and follow up SLP for diet tolerance and upgrades as appropriate. Pt likely has poor ability to protect airway at this time given rib fractures and pain with cough. Pt also stated that he sometimes coughs when "chewing". Unsure if pt still chews tobacco, but this is certainly an aspiration risk at this time. SLP will follow. Above to RN.     Aspiration Risk  Mild    Diet Recommendation Dysphagia 2 (Fine chop);Nectar-thick liquid   Liquid Administration via: Cup;Straw Medication Administration: Whole meds with puree Supervision: Patient able to self feed;Full supervision/cueing for compensatory  strategies Compensations:  Slow rate;Small sips/bites;Multiple dry swallows after each bite/sip;Effortful swallow Postural Changes and/or Swallow Maneuvers: Seated upright 90 degrees;Upright 30-60 min after meal    Other  Recommendations Oral Care Recommendations: Oral care BID;Staff/trained caregiver to provide oral care Other Recommendations: Order thickener from pharmacy;Prohibited food (jello, ice cream, thin soups);Clarify dietary restrictions   Follow Up Recommendations  Skilled Nursing facility    Frequency and Duration min 2x/week  1 week   Pertinent Vitals/Pain VSS    SLP Swallow Goals   Pt will demonstrate safe and efficient consumption of least restrictive diet with use of strategies as needed.   Swallow Study Prior Functional Status   Lives at home alone, but family lives right next door.    General Date of Onset: 11/15/14 HPI: This is a 81 y.o. year old male with significant past medical history of HLD, osteopenia/osteoporosis, chronic renal insufficiency presenting with fall, rib fx, UTI. Per the family, patient still lives alone at home. Still self ambulating intermittently. Family lives next-door and helps with his ADLs. Per report, patient fell out of bed. Patient subsequently produces right hand, right ribs and right knee. Family denies any history of falls in the past. No reports of fevers or chills. No reports of weakness. Appetite stable. Family does report patient with foul-smelling urine recently. Presented to the ER afebrile, hemodynamically stable. Hemoglobin 11.7, creatinine 2.16, sodium 133, potassium 3.4. Multiple imaging studies including hip x-ray, left and right knee x-ray, right hand x-ray, CT of the head and neck, right rib x-ray obtained. Most predominant finding was fractures to the right anterolateral fifth through eighth ribs suspected. Chronic degenerative changes diffusely and osteopenia changes. Family feels patient unsafe to go home. Chest x-ray shows:  Fractures through the right anterolateral fifth through eighth ribs are suspected, though some of these may be chronic in nature. Patchy bilateral atelectasis is noted, with underlying scarring. Increased interstitial markings are likely transient in nature. No definite pleural effusion or pneumothorax is seen.SLP asked to evaluate swallow due to suspected aspiration with liquids.  Type of Study: Bedside swallow evaluation Previous Swallow Assessment: None on record Diet Prior to this Study: Regular;Thin liquids Temperature Spikes Noted: No Respiratory Status: Room air History of Recent Intubation: No Behavior/Cognition: Alert;Cooperative;Pleasant mood;Hard of hearing Oral Cavity - Dentition: Poor condition Self-Feeding Abilities: Able to feed self;Needs set up Patient Positioning: Upright in bed Baseline Vocal Quality: Clear Volitional Cough: Weak (painful for pt to cough due to rib fractures) Volitional Swallow: Able to elicit (seemingly decreased hyolaryngeal excursion)    Oral/Motor/Sensory Function Overall Oral Motor/Sensory Function: Appears within functional limits for tasks assessed Labial ROM: Within Functional Limits Labial Symmetry: Within Functional Limits Labial Strength: Within Functional Limits Labial Sensation: Within Functional Limits Lingual ROM: Within Functional Limits Lingual Symmetry: Within Functional Limits Lingual Strength: Reduced Lingual Sensation: Within Functional Limits Facial ROM: Within Functional Limits Facial Symmetry: Within Functional Limits Facial Strength: Within Functional Limits Facial Sensation: Within Functional Limits Velum:  (reduced bilaterally) Mandible: Within Functional Limits   Ice Chips Ice chips: Within functional limits Presentation: Spoon   Thin Liquid Thin Liquid: Impaired Presentation: Cup;Straw;Self Fed Pharyngeal  Phase Impairments: Suspected delayed Swallow;Decreased hyoid-laryngeal movement;Cough - Immediate;Cough - Delayed     Nectar Thick Nectar Thick Liquid: Within functional limits Presentation: Cup;Straw;Self Fed   Honey Thick Honey Thick Liquid: Not tested   Puree Puree: Within functional limits Presentation: Spoon   Solid       Solid: Impaired Presentation: Spoon Oral Phase Impairments: Impaired mastication Pharyngeal Phase Impairments:  Multiple swallows      Thank you,  Havery MorosDabney Porter, CCC-SLP [phone]  PORTER,DABNEY 11/16/2014,8:28 PM

## 2014-11-16 NOTE — Evaluation (Signed)
Occupational Therapy Evaluation Patient Details Name: [name] MRN: [id number] DOB: 21-Apr-1912 Today's Date: 11/16/2014    History of Present Illness History of Present Illness:This is a 81 y.o. year old male with significant past medical history of HLD, osteopenia/osteoporosis, chronic renal insufficiency presenting with fall, rib fx, UTI. Per the family, patient still lives alone at home. Still self ambulating intermittently. Family lives next-door and helps with his ADLs. Per report, patient fell out of bed. Patient subsequently produces right hand, right ribs and right knee. Family denies any history of falls in the past. No reports of fevers or chills. No reports of weakness. Appetite stable. Family does report patient with foul-smelling urine recently.   Clinical Impression   PTA pt lived at home alone, with family next door assisting in B/IADLs. Pt is non-ambulatory and uses a w/c for functional mobility tasks. Pt demonstrated limited range of motion (25% range) and decreased strength (3-/5) in BUE. Pt requires set-up and supervision for feeding tasks. Pt appears to be at baseline, requiring assistance in all B/IADLs. Strongly recommend SNF and 24/7 supervision following d/c.  No further acute OT services required.     Follow Up Recommendations  SNF;Supervision/Assistance - 24 hour          Precautions / Restrictions Precautions Precautions: Fall Precaution Comments: right fx ribs Restrictions Weight Bearing Restrictions: No      Mobility Bed Mobility               General bed mobility comments: Unable to test due to rib fractures limiting mobility     Balance Overall balance assessment:  (unable to assess)                                          ADL Overall ADL's : Needs assistance/impaired;At baseline Eating/Feeding: Set up;Supervision/ safety Eating/Feeding Details (indicate cue type and reason): Pt required assistance in meal  preparation/set-up; supervision required for safety and assistance as needed                                 Functional mobility during ADLs: Wheelchair (Per intake report pt uses w/c for functional mobility) General ADL Comments: Per report pt required assistance from family during all ADL tasks. Pt demonstrates limited range of motion and strength limiting ability to participate in and complete ADLs.      Vision Vision Assessment?: No apparent visual deficits          Pertinent Vitals/Pain Pain Assessment: No/denies pain (at rest)     Hand Dominance Right   Extremity/Trunk Assessment Upper Extremity Assessment Upper Extremity Assessment: Defer to OT evaluation   Lower Extremity Assessment Lower Extremity Assessment: Generalized weakness (bilateral knee flexion contractures of 15-20 degrees)       Communication Communication Communication: Expressive difficulties;HOH (speech is difficult to understand)   Cognition Arousal/Alertness: Awake/alert Behavior During Therapy: WFL for tasks assessed/performed Overall Cognitive Status: History of cognitive impairments - at baseline                                Home Living Family/patient expects to be discharged to:: Skilled nursing facility Living Arrangements: Alone  Prior Functioning/Environment Level of Independence: Needs assistance  Gait / Transfers Assistance Needed: pt is not ambulatory, mobilizes with a w/c...he apparently transfers bed to w/c independently ADL's / Homemaking Assistance Needed: it is assumed that pt needs full assist with all household activities and personal grooming        OT Diagnosis: Generalized weakness;Cognitive deficits   OT Problem List: Decreased strength;Decreased range of motion;Decreased activity tolerance;Decreased cognition;Decreased safety awareness;Pain;Impaired UE functional use                     Co-evaluation   Reason for Co-Treatment: Complexity of the patient's impairments (multi-system involvement);For patient/therapist safety PT goals addressed during session: Strengthening/ROM;Mobility/safety with mobility        End of Session Nurse Communication: Other (comment) (Speech consult)  Activity Tolerance: Patient tolerated treatment well Patient left: in bed;with call bell/phone within reach;with bed alarm set   Time: 0825-0930 OT Time Calculation (min): 65 min Charges:  OT General Charges $OT Visit: 1 Procedure OT Evaluation $Initial OT Evaluation Tier I: 1 Procedure G-Codes: OT G-codes **NOT FOR INPATIENT CLASS** Functional Assessment Tool Used: Clinical judgement Functional Limitation: Self care Self Care Current Status ([id number](G8987): At least 80 percent but less than 100 percent impaired, limited or restricted Self Care Goal Status ([id number](G8988): At least 80 percent but less than 100 percent impaired, limited or restricted Self Care Discharge Status [phone]([id number]): At least 80 percent but less than 100 percent impaired, limited or restricted  Ezra SitesLeslie Troxler, OTR/L  [phone]  11/16/2014, 9:55 AM

## 2014-11-16 NOTE — Clinical Social Work Psychosocial (Signed)
Clinical Social Work Department BRIEF PSYCHOSOCIAL ASSESSMENT 11/16/2014  Patient:  [name], [name]     Account Number:  [id number]     Admit date:  11/15/2014  Clinical Social Worker:  Legrand Como  Date/Time:  11/16/2014 04:00 PM  Referred by:  CSW  Date Referred:  11/16/2014 Referred for  SNF Placement   Other Referral:   Interview type:  Family Other interview type:   Son, [name]    PSYCHOSOCIAL DATA Living Status:  ALONE Admitted from facility:   Level of care:   Primary support name:  [name] Primary support relationship to patient:  CHILD, ADULT Degree of support available:   Son and son's wife are very supportive. They live next door to patient.    CURRENT CONCERNS Current Concerns  Post-Acute Placement   Other Concerns:    SOCIAL WORK ASSESSMENT / PLAN CSW met with patient who was asleep. CSW contacted patient's son, [name]. Mr. Mustin indicated that patient lives alone.  He stated that he and his wife live next door to patient.  Mr. Foutz indicated that his wife cooks meals for patient and takes them to him to ensure that he eats.  He stated that they check on him every night before patient goes to bed.  Mr. Radney indicated that patient uses a wheelchair. He stated that patient was able to move the wheelchair with his feet but recently patient began to report that he could not move his left leg.  He stated that patient has not walked in a "long, long time."  Mr. Kusek stated that when he or his wife would push patient in his wheel chair, patient would have to pick up his left leg with his hands. Mr. Kirksey indicated that his choices in SNF would be [facility], [facility] and [facility].   Assessment/plan status:  Information/Referral to [employer] Other assessment/ plan:   Information/referral to community resources:    PATIENT'S/FAMILY'S RESPONSE TO PLAN OF CARE: Patient's son indicates that patient will have to  go to a SNF for rehab.  He stated that his first choice would be [facility].  CSW to assess patient when he is awake.    Ambrose Pancoast, [place]

## 2014-11-16 NOTE — H&P (Signed)
Hospitalist Admission History and Physical  Patient name: [name] Medical record number: [id number] Date of birth: 05/08/1912 Age: 81 y.o. Gender: male  Primary Care Provider: Alice ReichertMCINNIS,ANGUS G, MD  Chief Complaint: fall, rib fx, UTI   History of Present Illness:This is a 81 year old male with significant past medical history of HLD, osteopenia/osteoporosis, chronic renal insufficiency presenting with fall, rib fx, UTI. Per the family, patient still lives alone at home. Still self ambulating intermittently. Family lives next-door and helps with his ADLs. Per report, patient fell out of bed. Patient subsequently produces right hand, right ribs and right knee. Family denies any history of falls in the past. No reports of fevers or chills. No reports of weakness. Appetite stable. Family does report patient with foul-smelling urine recently. Presented to the ER afebrile, hemodynamically stable. Hemoglobin 11.7, creatinine 2.16, sodium 133, potassium 3.4. Multiple imaging studies including hip x-ray, left and right knee x-ray, right hand x-ray, CT of the head and neck, right rib x-ray obtained. Most predominant finding was fractures to the right anterolateral fifth through eighth ribs suspected. Chronic degenerative changes diffusely and osteopenia changes. Family feels patient unsafe to go home.  Assessment and Plan: Richard Sabinadgar J Cope is a 81 year old male presenting with rib fracture, fall, UTI  Active Problems:   Rib fracture   Fall   UTI (lower urinary tract infection)   1- Fall/Rib fx -suspect mechanical etiology  -family reports pt at relative baseline apart from mild bruising -pain control  -I/S  -PT/OT  -vit D level  -Follow  2- UTI  -IV rocephin  -urine culture  -follow   3- CKD -Cr at baseline  -follow  4-Hyponatremia  -midly dry though w/ noted LE swelling -hold lasix  -urine sodium  -follow    FEN/GI: heart healthy diet. Replete K   Prophylaxis: sub q heparin  Disposition: pending further evaluation  Code Status:Full Code    Patient Active Problem List   Diagnosis Date Noted  . Rib fracture 11/16/2014   Past Medical History: Past Medical History  Diagnosis Date  . Hypercholesteremia     Past Surgical History: History reviewed. No pertinent past surgical history.  Social History: History   Social History  . Marital Status: Widowed    Spouse Name: N/A  . Number of Children: N/A  . Years of Education: N/A   Social History Main Topics  . Smoking status: Unknown If Ever Smoked  . Smokeless tobacco: Not on file  . Alcohol Use: No  . Drug Use: No  . Sexual Activity: Not on file   Other Topics Concern  . None   Social History Narrative  . None    Family History: History reviewed. No pertinent family history.  Allergies: No Known Allergies  Current Facility-Administered Medications  Medication Dose Route Frequency Provider Last Rate Last Dose  . 0.9 % NaCl with KCl 20 mEq/ L  infusion   Intravenous Continuous Floydene FlockSteven J Mael Delap, MD      . aspirin EC tablet 81 mg  81 mg Oral Daily Floydene FlockSteven J Liesel Peckenpaugh, MD      . bacitracin 500 UNIT/GM ointment           . cefTRIAXone (ROCEPHIN) 1 g in dextrose 5 % 50 mL IVPB  1 g Intravenous Q24H Floydene FlockSteven J Josanne Boerema, MD      . heparin injection 5,000 Units  5,000 Units Subcutaneous 3 times per day Floydene FlockSteven J Joelys Staubs, MD      .  morphine 2 MG/ML injection 2-4 mg  2-4 mg Intravenous Q3H PRN Floydene Flock, MD      . simvastatin (ZOCOR) tablet 20 mg  20 mg Oral Daily Floydene Flock, MD       Current Outpatient Prescriptions  Medication Sig Dispense Refill  . aspirin EC 81 MG tablet Take 81 mg by mouth daily.    . ferrous sulfate 325 (65 FE) MG tablet Take 325 mg by mouth 2 (two) times daily.    . furosemide (LASIX) 40 MG tablet Take 40-60 mg by mouth 2 (two) times daily. 60mg  in the Am and 40mg  in the Pm.    . potassium chloride SA (K-DUR,KLOR-CON) 20 MEQ tablet Take 20 mEq  by mouth 2 (two) times daily.  0  . simvastatin (ZOCOR) 20 MG tablet Take 20 mg by mouth daily.  0   Review Of Systems: 12 point ROS negative except as noted above in HPI.  Physical Exam: Filed Vitals:   11/16/14 0137  BP: 110/69  Pulse: 59  Temp:   Resp: 14    General: cooperative and cachectic HEENT: PERRLA and extra ocular movement intact, dry oral mucosa Heart: S1, S2 normal, no murmur, rub or gallop, regular rate and rhythm Lungs: clear to auscultation, no wheezes or rales and unlabored breathing Abdomen: abdomen is soft without significant tenderness, masses, organomegaly or guarding Extremities: R dorsal hand laceration/wound, stable, otherwise stable exam Skin:as above  Neurology: normal without focal findings  Labs and Imaging: Lab Results  Component Value Date/Time   NA 133* 11/15/2014 08:23 PM   K 3.4* 11/15/2014 08:23 PM   CL 100 11/15/2014 08:23 PM   CO2 27 11/15/2014 08:23 PM   BUN 35* 11/15/2014 08:23 PM   CREATININE 2.16* 11/15/2014 08:23 PM   GLUCOSE 85 11/15/2014 08:23 PM   Lab Results  Component Value Date   WBC 5.2 11/15/2014   HGB 11.7* 11/15/2014   HCT 35.9* 11/15/2014   MCV 103.5* 11/15/2014   PLT 167 11/15/2014    Dg Ribs Unilateral W/chest Right  11/15/2014   CLINICAL DATA:  Status post fall from wheelchair onto floor. Right anterior and lateral rib pain. Initial encounter.  EXAM: RIGHT RIBS AND CHEST - 3+ VIEW  COMPARISON:  Chest radiograph performed 03/04/2010  FINDINGS: Fractures through the right anterolateral fifth through eighth ribs are suspected, though some of these may be chronic in nature.  Patchy bilateral atelectasis is noted, with underlying scarring. Increased interstitial markings are likely transient in nature. No definite pleural effusion or pneumothorax is seen.  The cardiomediastinal silhouette is enlarged. The patient is status post vertebroplasty at multiple levels along the thoracic spine.  IMPRESSION: 1. Fractures through  the right anterolateral fifth through eighth ribs suspected, though some of these may be chronic in nature. 2. Patchy bilateral atelectasis, with underlying scarring. Increased interstitial markings are likely transient in nature. 3. Cardiomegaly noted.   Electronically Signed   By: Roanna Raider M.D.   On: 11/15/2014 21:38   Ct Head Wo Contrast  11/15/2014   CLINICAL DATA:  Fall from wheelchair yesterday, dementia  EXAM: CT HEAD WITHOUT CONTRAST  CT CERVICAL SPINE WITHOUT CONTRAST  TECHNIQUE: Multidetector CT imaging of the head and cervical spine was performed following the standard protocol without intravenous contrast. Multiplanar CT image reconstructions of the cervical spine were also generated.  COMPARISON:  11/20/2013  FINDINGS: CT HEAD FINDINGS  No skull fracture is noted. Paranasal sinuses and mastoid air cells are unremarkable. Atherosclerotic calcifications  of carotid siphon are again noted. No intracranial hemorrhage, mass effect or midline shift. Moderate cerebral atrophy. Extensive periventricular and subcortical white matter decreased attenuation consistent with chronic small vessel ischemic changes again noted.  CT CERVICAL SPINE FINDINGS  Axial images of the cervical spine shows no acute fracture or subluxation. There is diffuse osteopenia. There is no pneumothorax in visualized lung apices. Computer processed images shows no acute fracture or subluxation. Degenerative changes are noted C1-C2 articulation. There is mild disc space flattening with minimal posterior spurring at C3-C4 level. Mild disc space flattening at C4-C5 and C5-C6 level. Multilevel facet degenerative changes are noted. Mild anterior spurring lower endplate of C6 vertebral body. There is probable chronic mild compression deformity upper endplate of T1 vertebral body. No definite evidence of acute fracture.  No prevertebral soft tissue swelling.  Cervical airway is patent.  IMPRESSION: 1. No acute intracranial abnormality.  Moderate cerebral atrophy. Extensive chronic white matter disease. No definite acute cortical infarction. 2. No cervical spine acute fracture or subluxation. Diffuse osteopenia. Multilevel degenerative changes as described above. Probable chronic mild compression deformity upper endplate of T1 vertebral body.   Electronically Signed   By: Natasha Mead M.D.   On: 11/15/2014 21:50   Ct Cervical Spine Wo Contrast  11/15/2014   CLINICAL DATA:  Fall from wheelchair yesterday, dementia  EXAM: CT HEAD WITHOUT CONTRAST  CT CERVICAL SPINE WITHOUT CONTRAST  TECHNIQUE: Multidetector CT imaging of the head and cervical spine was performed following the standard protocol without intravenous contrast. Multiplanar CT image reconstructions of the cervical spine were also generated.  COMPARISON:  11/20/2013  FINDINGS: CT HEAD FINDINGS  No skull fracture is noted. Paranasal sinuses and mastoid air cells are unremarkable. Atherosclerotic calcifications of carotid siphon are again noted. No intracranial hemorrhage, mass effect or midline shift. Moderate cerebral atrophy. Extensive periventricular and subcortical white matter decreased attenuation consistent with chronic small vessel ischemic changes again noted.  CT CERVICAL SPINE FINDINGS  Axial images of the cervical spine shows no acute fracture or subluxation. There is diffuse osteopenia. There is no pneumothorax in visualized lung apices. Computer processed images shows no acute fracture or subluxation. Degenerative changes are noted C1-C2 articulation. There is mild disc space flattening with minimal posterior spurring at C3-C4 level. Mild disc space flattening at C4-C5 and C5-C6 level. Multilevel facet degenerative changes are noted. Mild anterior spurring lower endplate of C6 vertebral body. There is probable chronic mild compression deformity upper endplate of T1 vertebral body. No definite evidence of acute fracture.  No prevertebral soft tissue swelling.  Cervical airway is  patent.  IMPRESSION: 1. No acute intracranial abnormality. Moderate cerebral atrophy. Extensive chronic white matter disease. No definite acute cortical infarction. 2. No cervical spine acute fracture or subluxation. Diffuse osteopenia. Multilevel degenerative changes as described above. Probable chronic mild compression deformity upper endplate of T1 vertebral body.   Electronically Signed   By: Natasha Mead M.D.   On: 11/15/2014 21:50   Dg Knee Complete 4 Views Left  11/16/2014   CLINICAL DATA:  One 81 year old male post fall at home. Initial encounter.  EXAM: LEFT KNEE - COMPLETE 4+ VIEW  COMPARISON:  None.  FINDINGS: The bones are under mineralized. There is medial and lateral tibial femoral joint space narrowing and chondrocalcinosis. Mild spurring of the patella. No acute fracture. No joint effusion. Vascular calcifications are seen.  IMPRESSION: Degenerative change of the left knee without acute fracture.   Electronically Signed   By: Rubye Oaks  M.D.   On: 11/16/2014 01:06   Dg Knee Complete 4 Views Right  11/15/2014   CLINICAL DATA:  Larey Seat from wheelchair to floor, with right knee pain. Initial encounter.  EXAM: RIGHT KNEE - COMPLETE 4+ VIEW  COMPARISON:  Right knee radiographs performed 03/14/2009  FINDINGS: There is no evidence of fracture or dislocation. The joint spaces are preserved. Small marginal osteophytes are seen at the lateral and patellofemoral compartments.  Trace knee joint fluid remains within normal limits. Scattered vascular calcifications are seen.  IMPRESSION: 1. No evidence of fracture or dislocation. 2. Minimal osteoarthritis at the right knee. 3. Scattered vascular calcifications seen.   Electronically Signed   By: Roanna Raider M.D.   On: 11/15/2014 22:19   Dg Hand Complete Right  11/15/2014   CLINICAL DATA:  Larey Seat from wheelchair onto floor, with right hand pain and skin tear between the thumb and index finger. Initial encounter.  EXAM: RIGHT HAND - COMPLETE 3+ VIEW   COMPARISON:  None.  FINDINGS: The known soft tissue laceration is difficult to fully characterize on radiograph. No radiopaque foreign bodies are seen.  There is diffuse osteopenia of visualized osseous structures. Degenerative change is noted at the first carpometacarpal joint, and mild degenerative change is noted at the proximal carpal row. Calcification of the triangular fibrocartilage is noted. There is no evidence of fracture or dislocation.  IMPRESSION: 1. No radiopaque foreign bodies seen. 2. Diffuse osteopenia of visualized osseous structures. 3. No evidence of fracture or dislocation. 4. Osteoarthritis of the right hand, with calcification of the triangular fibrocartilage.   Electronically Signed   By: Roanna Raider M.D.   On: 11/15/2014 22:12   Dg Hip Unilat With Pelvis 2-3 Views Left  11/16/2014   CLINICAL DATA:  One 81 year old male post fall at home, initial encounter.  EXAM: LEFT HIP (WITH PELVIS) 2-3 VIEWS  COMPARISON:  None.  FINDINGS: The bones are significantly under mineralized. Postsurgical change with lateral plate and screw fixation of the right hip. The cortical margins of the bony pelvis and left hip are intact. No displaced fracture. Pubic symphysis and sacroiliac joints are congruent. Both femoral heads are well-seated in the respective acetabula.  IMPRESSION: No displaced left hip fracture. The bones are significantly under mineralized. If there is high clinical suspicion for occult hip fracture or the patient refuses to weightbear, consider further evaluation with MRI. Although CT is expeditious, evidence is lacking regarding accuracy of CT over plain film radiography.   Electronically Signed   By: Rubye Oaks M.D.   On: 11/16/2014 01:05           Doree Albee MD  Pager: [phone]

## 2014-11-17 LAB — CBC WITH DIFFERENTIAL/PLATELET
BASOS ABS: 0 10*3/uL (ref 0.0–0.1)
BASOS PCT: 0 % (ref 0–1)
EOS PCT: 1 % (ref 0–5)
Eosinophils Absolute: 0.1 10*3/uL (ref 0.0–0.7)
HEMATOCRIT: 32.5 % — AB (ref 39.0–52.0)
Hemoglobin: 10.6 g/dL — ABNORMAL LOW (ref 13.0–17.0)
Lymphocytes Relative: 26 % (ref 12–46)
Lymphs Abs: 1.2 10*3/uL (ref 0.7–4.0)
MCH: 34.1 pg — ABNORMAL HIGH (ref 26.0–34.0)
MCHC: 32.6 g/dL (ref 30.0–36.0)
MCV: 104.5 fL — ABNORMAL HIGH (ref 78.0–100.0)
MONO ABS: 0.5 10*3/uL (ref 0.1–1.0)
MONOS PCT: 11 % (ref 3–12)
Neutro Abs: 2.8 10*3/uL (ref 1.7–7.7)
Neutrophils Relative %: 62 % (ref 43–77)
Platelets: 149 10*3/uL — ABNORMAL LOW (ref 150–400)
RBC: 3.11 MIL/uL — ABNORMAL LOW (ref 4.22–5.81)
RDW: 14 % (ref 11.5–15.5)
WBC: 4.5 10*3/uL (ref 4.0–10.5)

## 2014-11-17 LAB — COMPREHENSIVE METABOLIC PANEL
ALT: 7 U/L (ref 0–53)
AST: 15 U/L (ref 0–37)
Albumin: 2.4 g/dL — ABNORMAL LOW (ref 3.5–5.2)
Alkaline Phosphatase: 79 U/L (ref 39–117)
Anion gap: 3 — ABNORMAL LOW (ref 5–15)
BUN: 39 mg/dL — ABNORMAL HIGH (ref 6–23)
CALCIUM: 7.7 mg/dL — AB (ref 8.4–10.5)
CO2: 25 mmol/L (ref 19–32)
CREATININE: 1.93 mg/dL — AB (ref 0.50–1.35)
Chloride: 110 mmol/L (ref 96–112)
GFR calc Af Amer: 31 mL/min — ABNORMAL LOW (ref >90)
GFR, EST NON AFRICAN AMERICAN: 27 mL/min — AB (ref >90)
Glucose, Bld: 86 mg/dL (ref 70–99)
Potassium: 4.8 mmol/L (ref 3.5–5.1)
SODIUM: 138 mmol/L (ref 135–145)
Total Bilirubin: 0.8 mg/dL (ref 0.3–1.2)
Total Protein: 6 g/dL (ref 6.0–8.3)

## 2014-11-17 LAB — VITAMIN D 25 HYDROXY (VIT D DEFICIENCY, FRACTURES): Vit D, 25-Hydroxy: <4 ng/mL — ABNORMAL LOW (ref 30.0–100.0)

## 2014-11-17 NOTE — Progress Notes (Signed)
Physical Therapy Treatment Patient Details Name: [name] J Martinek MRN: [id number] DOB: 12/05/1911 Today's Date: 11/17/2014    History of Present Illness History of Present Illness:This is a [age] y.o. year old male with significant past medical history of HLD, osteopenia/osteoporosis, chronic renal insufficiency presenting with fall, rib fx, UTI. Per the family, patient still lives alone at home. Still self ambulating intermittently. Family lives next-door and helps with his ADLs. Per report, patient fell out of bed. Patient subsequently produces right hand, right ribs and right knee. Family denies any history of falls in the past. No reports of fevers or chills. No reports of weakness. Appetite stable. Family does report patient with foul-smelling urine recently.    PT Comments    Pt continues to be very pleasant and cooperative, no pain at rest.  He was able to tolerate gentle LE/UE exercise while supine.  He needed total assist to transfer supine to sit but was able to maintain sitting at EOB unsupported for a short period of time.  He required max assist to pivot transfer bed to chair.  He is now up in a chair and very comfortable.  Follow Up Recommendations  SNF     Equipment Recommendations  None recommended by PT    Recommendations for Other Services  none     Precautions / Restrictions Precautions Precautions: Fall Precaution Comments: right fx ribs Restrictions Weight Bearing Restrictions: No    Mobility  Bed Mobility Overal bed mobility: Needs Assistance Bed Mobility: Supine to Sit     Supine to sit: Total assist;HOB elevated        Transfers Overall transfer level: Needs assistance Equipment used: None Transfers: Squat Pivot Transfers     Squat pivot transfers: Max assist     General transfer comment: pt is able to bear minimal weight on his LEs  Ambulation/Gait Ambulation/Gait assistance:  (unable to ambulate)               Stairs             Wheelchair Mobility    Modified Rankin (Stroke Patients Only)       Balance Overall balance assessment: Needs assistance Sitting-balance support: Bilateral upper extremity supported;Feet supported Sitting balance-Leahy Scale: Fair                              Cognition Arousal/Alertness: Awake/alert Behavior During Therapy: WFL for tasks assessed/performed Overall Cognitive Status: History of cognitive impairments - at baseline                      Exercises General Exercises - Lower Extremity Ankle Circles/Pumps: AROM;AAROM;Both;10 reps;Supine Quad Sets: AROM;Both;10 reps;Supine Short Arc Quad: AAROM;Both;10 reps;Supine Heel Slides: AAROM;Both;10 reps;Supine Hip ABduction/ADduction: AAROM;Both;10 reps;Supine    General Comments        Pertinent Vitals/Pain Pain Assessment: No/denies pain    Home Living                      Prior Function            PT Goals (current goals can now be found in the care plan section) Progress towards PT goals: Progressing toward goals    Frequency  Min 5X/week    PT Plan Current plan remains appropriate    Co-evaluation             End of Session Equipment Utilized During Treatment: Gait belt Activity Tolerance:  Patient tolerated treatment well Patient left: in chair;with call bell/phone within reach;with chair alarm set     Time: 0922-0958 PT Time Calculation (min) (ACUTE ONLY): 36 min  Charges:  $Therapeutic Exercise: 8-22 mins $Therapeutic Activity: 8-22 mins                    G Codes:      Konrad Penta 2014/12/16, 10:59 AM

## 2014-11-17 NOTE — Clinical Social Work Note (Signed)
CSW presented bed offers and [facility] Anticipate d/c Sunday per MD. Facility notified and agreeable.   Derenda FennelKara Vernel Donlan, [place]CS[place] [phone]

## 2014-11-17 NOTE — Clinical Social Work Placement (Signed)
Clinical Social Work Department CLINICAL SOCIAL WORK PLACEMENT NOTE 11/17/2014  Patient:  Floy SabinaLLINGTON,Marquese J  Account Number:  [id number] Admit date:  11/15/2014  Clinical Social Worker:  Tretha SciaraHEATHER SETTLE, LCSW  Date/time:  11/16/2014 04:21 PM  Clinical Social Work is seeking post-discharge placement for this patient at the following level of care:   SKILLED NURSING   (*CSW will update this form in Epic as items are completed)   11/16/2014  [facility]family provided with [facility] Work's list of facilities offering this level of care within the geographic area requested by the patient (or if unable, by the patient's family).  11/16/2014  Patient/family informed of their freedom to choose among providers that offer the needed level of care, that participate in Medicare, Medicaid or managed care program needed by the patient, have an available bed and are willing to accept the patient.  11/16/2014  Patient/family informed of [facility]' ownership interest in [facility], as well as of the fact that they are under no obligation to receive care at this facility.  PASARR submitted to EDS on 11/16/2014 PASARR number received on 11/16/2014  FL2 transmitted to all facilities in geographic area requested by pt/family on  11/16/2014 FL2 transmitted to all facilities within larger geographic area on   Patient informed that his/her managed care company has contracts with or will negotiate with  certain facilities, including the following:     Patient/family informed of bed offers received:  11/17/2014 [facility] Physician recommends and patient chooses bed at    Patient to be transferred to  on   Patient to be transferred to facility by  Patient and family notified of transfer on  Name of family member notified:    The following physician request were entered in Epic:   Additional Comments:  Derenda FennelKara Schneur Crowson,  LCSW [phone]

## 2014-11-17 NOTE — Progress Notes (Signed)
Subjective: The patient is alert and oriented. He does have rib fractures 5 through 8 on right and laceration right hand. It does also have urinary tract infection and chronic renal insufficiency. His current creatinine 2.03  Objective: Vital signs in last 24 hours: Temp:  [97.6 F (36.4 C)-98 F (36.7 C)] 98 F (36.7 C) (03/24 2100) Pulse Rate:  [51-60] 51 (03/24 2100) Resp:  [14-16] 16 (03/24 2100) BP: (101-111)/(46-76) 111/58 mmHg (03/24 2100) SpO2:  [91 %-98 %] 91 % (03/24 2100) Weight:  [69.446 kg (153 lb 1.6 oz)] 69.446 kg (153 lb 1.6 oz) (03/24 1610) Weight change:  Last BM Date: 11/16/14  Intake/Output from previous day: 03/24 0701 - 03/25 0700 In: 1717.9 [P.O.:120; I.V.:1597.9] Out: 400 [Urine:400] Intake/Output this shift: Total I/O In: 120 [P.O.:120] Out: -   Physical Exam: Gen. appearance-the patient is alert and oriented  HEENT negative  Neck supple no JVD or thyroid abnormalities  Heart regular rhythm no murmurs  Lungs clear to P&A tenderness over the right rib cage  Abdomen the palpable organs or masses  Extremities-skin tear right hand lower extremities slight tenderness at hip on left for range of motion   Recent Labs  11/15/14 2029 11/16/14 0645  WBC 5.2 4.6  HGB 11.7* 11.0*  HCT 35.9* 33.5*  PLT 167 146*   BMET  Recent Labs  11/15/14 2023 11/16/14 0645  NA 133* 136  K 3.4* 4.4  CL 100 105  CO2 27 26  GLUCOSE 85 82  BUN 35* 36*  CREATININE 2.16* 2.03*  CALCIUM 7.9* 7.7*    Studies/Results: Dg Ribs Unilateral W/chest Right  11/15/2014   CLINICAL DATA:  Status post fall from wheelchair onto floor. Right anterior and lateral rib pain. Initial encounter.  EXAM: RIGHT RIBS AND CHEST - 3+ VIEW  COMPARISON:  Chest radiograph performed 03/04/2010  FINDINGS: Fractures through the right anterolateral fifth through eighth ribs are suspected, though some of these may be chronic in nature.  Patchy bilateral atelectasis is noted, with  underlying scarring. Increased interstitial markings are likely transient in nature. No definite pleural effusion or pneumothorax is seen.  The cardiomediastinal silhouette is enlarged. The patient is status post vertebroplasty at multiple levels along the thoracic spine.  IMPRESSION: 1. Fractures through the right anterolateral fifth through eighth ribs suspected, though some of these may be chronic in nature. 2. Patchy bilateral atelectasis, with underlying scarring. Increased interstitial markings are likely transient in nature. 3. Cardiomegaly noted.   Electronically Signed   By: Roanna Raider M.D.   On: 11/15/2014 21:38   Ct Head Wo Contrast  11/15/2014   CLINICAL DATA:  Fall from wheelchair yesterday, dementia  EXAM: CT HEAD WITHOUT CONTRAST  CT CERVICAL SPINE WITHOUT CONTRAST  TECHNIQUE: Multidetector CT imaging of the head and cervical spine was performed following the standard protocol without intravenous contrast. Multiplanar CT image reconstructions of the cervical spine were also generated.  COMPARISON:  11/20/2013  FINDINGS: CT HEAD FINDINGS  No skull fracture is noted. Paranasal sinuses and mastoid air cells are unremarkable. Atherosclerotic calcifications of carotid siphon are again noted. No intracranial hemorrhage, mass effect or midline shift. Moderate cerebral atrophy. Extensive periventricular and subcortical white matter decreased attenuation consistent with chronic small vessel ischemic changes again noted.  CT CERVICAL SPINE FINDINGS  Axial images of the cervical spine shows no acute fracture or subluxation. There is diffuse osteopenia. There is no pneumothorax in visualized lung apices. Computer processed images shows no acute fracture or subluxation. Degenerative changes are  noted C1-C2 articulation. There is mild disc space flattening with minimal posterior spurring at C3-C4 level. Mild disc space flattening at C4-C5 and C5-C6 level. Multilevel facet degenerative changes are noted. Mild  anterior spurring lower endplate of C6 vertebral body. There is probable chronic mild compression deformity upper endplate of T1 vertebral body. No definite evidence of acute fracture.  No prevertebral soft tissue swelling.  Cervical airway is patent.  IMPRESSION: 1. No acute intracranial abnormality. Moderate cerebral atrophy. Extensive chronic white matter disease. No definite acute cortical infarction. 2. No cervical spine acute fracture or subluxation. Diffuse osteopenia. Multilevel degenerative changes as described above. Probable chronic mild compression deformity upper endplate of T1 vertebral body.   Electronically Signed   By: Natasha Mead M.D.   On: 11/15/2014 21:50   Ct Cervical Spine Wo Contrast  11/15/2014   CLINICAL DATA:  Fall from wheelchair yesterday, dementia  EXAM: CT HEAD WITHOUT CONTRAST  CT CERVICAL SPINE WITHOUT CONTRAST  TECHNIQUE: Multidetector CT imaging of the head and cervical spine was performed following the standard protocol without intravenous contrast. Multiplanar CT image reconstructions of the cervical spine were also generated.  COMPARISON:  11/20/2013  FINDINGS: CT HEAD FINDINGS  No skull fracture is noted. Paranasal sinuses and mastoid air cells are unremarkable. Atherosclerotic calcifications of carotid siphon are again noted. No intracranial hemorrhage, mass effect or midline shift. Moderate cerebral atrophy. Extensive periventricular and subcortical white matter decreased attenuation consistent with chronic small vessel ischemic changes again noted.  CT CERVICAL SPINE FINDINGS  Axial images of the cervical spine shows no acute fracture or subluxation. There is diffuse osteopenia. There is no pneumothorax in visualized lung apices. Computer processed images shows no acute fracture or subluxation. Degenerative changes are noted C1-C2 articulation. There is mild disc space flattening with minimal posterior spurring at C3-C4 level. Mild disc space flattening at C4-C5 and C5-C6  level. Multilevel facet degenerative changes are noted. Mild anterior spurring lower endplate of C6 vertebral body. There is probable chronic mild compression deformity upper endplate of T1 vertebral body. No definite evidence of acute fracture.  No prevertebral soft tissue swelling.  Cervical airway is patent.  IMPRESSION: 1. No acute intracranial abnormality. Moderate cerebral atrophy. Extensive chronic white matter disease. No definite acute cortical infarction. 2. No cervical spine acute fracture or subluxation. Diffuse osteopenia. Multilevel degenerative changes as described above. Probable chronic mild compression deformity upper endplate of T1 vertebral body.   Electronically Signed   By: Natasha Mead M.D.   On: 11/15/2014 21:50   US Venous Img Lower Bilateral  11/16/2014   CLINICAL DATA:  Bilateral lower extremity edema.  EXAM: BILATERAL LOWER EXTREMITY VENOUS DOPPLER ULTRASOUND  TECHNIQUE: Gray-scale sonography with graded compression, as well as color Doppler and duplex ultrasound were performed to evaluate the lower extremity deep venous systems from the level of the common femoral vein and including the common femoral, femoral, profunda femoral, popliteal and calf veins including the posterior tibial, peroneal and gastrocnemius veins when visible. The superficial great saphenous vein was also interrogated. Spectral Doppler was utilized to evaluate flow at rest and with distal augmentation maneuvers in the common femoral, femoral and popliteal veins.  COMPARISON:  None.  FINDINGS: RIGHT LOWER EXTREMITY  Common Femoral Vein: No evidence of thrombus. Normal compressibility, respiratory phasicity and response to augmentation.  Saphenofemoral Junction: No evidence of thrombus. Normal compressibility and flow on color Doppler imaging.  Profunda Femoral Vein: No evidence of thrombus. Normal compressibility and flow on color Doppler imaging.  Femoral  Vein: No evidence of thrombus. Normal compressibility,  respiratory phasicity and response to augmentation.  Popliteal Vein: No evidence of thrombus. Normal compressibility, respiratory phasicity and response to augmentation.  Calf Veins: No evidence of thrombus. Normal compressibility and flow on color Doppler imaging.  Superficial Great Saphenous Vein: No evidence of thrombus. Normal compressibility and flow on color Doppler imaging.  Venous Reflux:  None.  Other Findings: No evidence of superficial thrombophlebitis or abnormal fluid collection.  LEFT LOWER EXTREMITY  Common Femoral Vein: No evidence of thrombus. Normal compressibility, respiratory phasicity and response to augmentation.  Saphenofemoral Junction: No evidence of thrombus. Normal compressibility and flow on color Doppler imaging.  Profunda Femoral Vein: No evidence of thrombus. Normal compressibility and flow on color Doppler imaging.  Femoral Vein: No evidence of thrombus. Normal compressibility, respiratory phasicity and response to augmentation.  Popliteal Vein: No evidence of thrombus. Normal compressibility, respiratory phasicity and response to augmentation.  Calf Veins: No evidence of thrombus. Normal compressibility and flow on color Doppler imaging.  Superficial Great Saphenous Vein: No evidence of thrombus. Normal compressibility and flow on color Doppler imaging.  Venous Reflux:  None.  Other Findings: No evidence of superficial thrombophlebitis or abnormal fluid collection.  IMPRESSION: No evidence of bilateral lower extremity deep venous thrombosis.   Electronically Signed   By: Irish Lack M.D.   On: 11/16/2014 16:15   Dg Knee Complete 4 Views Left  11/16/2014   CLINICAL DATA:  One 81 year old male post fall at home. Initial encounter.  EXAM: LEFT KNEE - COMPLETE 4+ VIEW  COMPARISON:  None.  FINDINGS: The bones are under mineralized. There is medial and lateral tibial femoral joint space narrowing and chondrocalcinosis. Mild spurring of the patella. No acute fracture. No joint  effusion. Vascular calcifications are seen.  IMPRESSION: Degenerative change of the left knee without acute fracture.   Electronically Signed   By: Rubye Oaks M.D.   On: 11/16/2014 01:06   Dg Knee Complete 4 Views Right  11/15/2014   CLINICAL DATA:  Larey Seat from wheelchair to floor, with right knee pain. Initial encounter.  EXAM: RIGHT KNEE - COMPLETE 4+ VIEW  COMPARISON:  Right knee radiographs performed 03/14/2009  FINDINGS: There is no evidence of fracture or dislocation. The joint spaces are preserved. Small marginal osteophytes are seen at the lateral and patellofemoral compartments.  Trace knee joint fluid remains within normal limits. Scattered vascular calcifications are seen.  IMPRESSION: 1. No evidence of fracture or dislocation. 2. Minimal osteoarthritis at the right knee. 3. Scattered vascular calcifications seen.   Electronically Signed   By: Roanna Raider M.D.   On: 11/15/2014 22:19   Dg Hand Complete Right  11/15/2014   CLINICAL DATA:  Larey Seat from wheelchair onto floor, with right hand pain and skin tear between the thumb and index finger. Initial encounter.  EXAM: RIGHT HAND - COMPLETE 3+ VIEW  COMPARISON:  None.  FINDINGS: The known soft tissue laceration is difficult to fully characterize on radiograph. No radiopaque foreign bodies are seen.  There is diffuse osteopenia of visualized osseous structures. Degenerative change is noted at the first carpometacarpal joint, and mild degenerative change is noted at the proximal carpal row. Calcification of the triangular fibrocartilage is noted. There is no evidence of fracture or dislocation.  IMPRESSION: 1. No radiopaque foreign bodies seen. 2. Diffuse osteopenia of visualized osseous structures. 3. No evidence of fracture or dislocation. 4. Osteoarthritis of the right hand, with calcification of the triangular fibrocartilage.   Electronically  Signed   By: Roanna RaiderJeffery  Chang M.D.   On: 11/15/2014 22:12   Dg Hip Unilat With Pelvis 2-3 Views  Left  11/16/2014   CLINICAL DATA:  One 84hundred year old male post fall at home, initial encounter.  EXAM: LEFT HIP (WITH PELVIS) 2-3 VIEWS  COMPARISON:  None.  FINDINGS: The bones are significantly under mineralized. Postsurgical change with lateral plate and screw fixation of the right hip. The cortical margins of the bony pelvis and left hip are intact. No displaced fracture. Pubic symphysis and sacroiliac joints are congruent. Both femoral heads are well-seated in the respective acetabula.  IMPRESSION: No displaced left hip fracture. The bones are significantly under mineralized. If there is high clinical suspicion for occult hip fracture or the patient refuses to weightbear, consider further evaluation with MRI. Although CT is expeditious, evidence is lacking regarding accuracy of CT over plain film radiography.   Electronically Signed   By: Rubye OaksMelanie  Ehinger M.D.   On: 11/16/2014 01:05    Medications:  . aspirin EC  81 mg Oral Daily  . cefTRIAXone (ROCEPHIN)  IV  1 g Intravenous Q24H  . feeding supplement (ENSURE ENLIVE)  237 mL Oral BID BM  . heparin  5,000 Units Subcutaneous 3 times per day  . simvastatin  20 mg Oral Daily    . 0.9 % NaCl with KCl 20 mEq / L 125 mL/hr at 11/17/14 [id number]     Assessment/Plan: 1. Rib fractures secondary to fall on right-plan to continue comfort measures and pain control  2. Urinary tract infection-plan to continue IV Rocephin  3. CK D at baseline  4. Hyponatremia-plan to continue to monitor chemistries  5. Hypocalcemia to repeat chemistries   LOS: 1 day   Nikala Walsworth G 11/17/2014, 6:10 AM

## 2014-11-18 LAB — CBC WITH DIFFERENTIAL/PLATELET
BASOS PCT: 0 % (ref 0–1)
Basophils Absolute: 0 10*3/uL (ref 0.0–0.1)
Eosinophils Absolute: 0.1 10*3/uL (ref 0.0–0.7)
Eosinophils Relative: 2 % (ref 0–5)
HEMATOCRIT: 32.9 % — AB (ref 39.0–52.0)
HEMOGLOBIN: 10.5 g/dL — AB (ref 13.0–17.0)
Lymphocytes Relative: 29 % (ref 12–46)
Lymphs Abs: 1.3 10*3/uL (ref 0.7–4.0)
MCH: 33.5 pg (ref 26.0–34.0)
MCHC: 31.9 g/dL (ref 30.0–36.0)
MCV: 105.1 fL — ABNORMAL HIGH (ref 78.0–100.0)
Monocytes Absolute: 0.4 10*3/uL (ref 0.1–1.0)
Monocytes Relative: 9 % (ref 3–12)
NEUTROS ABS: 2.7 10*3/uL (ref 1.7–7.7)
NEUTROS PCT: 60 % (ref 43–77)
Platelets: 157 10*3/uL (ref 150–400)
RBC: 3.13 MIL/uL — ABNORMAL LOW (ref 4.22–5.81)
RDW: 14.1 % (ref 11.5–15.5)
WBC: 4.4 10*3/uL (ref 4.0–10.5)

## 2014-11-18 LAB — COMPREHENSIVE METABOLIC PANEL
ALBUMIN: 2.2 g/dL — AB (ref 3.5–5.2)
ALT: 7 U/L (ref 0–53)
ANION GAP: 5 (ref 5–15)
AST: 15 U/L (ref 0–37)
Alkaline Phosphatase: 75 U/L (ref 39–117)
BUN: 39 mg/dL — ABNORMAL HIGH (ref 6–23)
CHLORIDE: 112 mmol/L (ref 96–112)
CO2: 21 mmol/L (ref 19–32)
CREATININE: 1.79 mg/dL — AB (ref 0.50–1.35)
Calcium: 7.9 mg/dL — ABNORMAL LOW (ref 8.4–10.5)
GFR calc Af Amer: 34 mL/min — ABNORMAL LOW (ref >90)
GFR calc non Af Amer: 29 mL/min — ABNORMAL LOW (ref >90)
Glucose, Bld: 80 mg/dL (ref 70–99)
Potassium: 5.1 mmol/L (ref 3.5–5.1)
Sodium: 138 mmol/L (ref 135–145)
Total Bilirubin: 0.5 mg/dL (ref 0.3–1.2)
Total Protein: 5.7 g/dL — ABNORMAL LOW (ref 6.0–8.3)

## 2014-11-18 LAB — URINALYSIS, ROUTINE W REFLEX MICROSCOPIC
Bilirubin Urine: NEGATIVE
Glucose, UA: NEGATIVE mg/dL
Ketones, ur: NEGATIVE mg/dL
NITRITE: NEGATIVE
Protein, ur: 100 mg/dL — AB
Specific Gravity, Urine: 1.02 (ref 1.005–1.030)
Urobilinogen, UA: 0.2 mg/dL (ref 0.0–1.0)
pH: 6 (ref 5.0–8.0)

## 2014-11-18 LAB — PHOSPHORUS: Phosphorus: 2.5 mg/dL (ref 2.3–4.6)

## 2014-11-18 LAB — URINE MICROSCOPIC-ADD ON

## 2014-11-18 LAB — URINE CULTURE: Colony Count: >=100000

## 2014-11-18 LAB — SODIUM, URINE, RANDOM: Sodium, Ur: 35 mmol/L

## 2014-11-18 NOTE — Plan of Care (Signed)
Problem: Phase I Progression Outcomes Goal: OOB as tolerated unless otherwise ordered Outcome: Progressing Pt gets oob to chair

## 2014-11-18 NOTE — Progress Notes (Signed)
Subjective: The patient is alert and oriented. He does have rib fractures 5 through 8 on right and laceration right hand. He does have a urinary tract infection which is being treated and chronic renal insufficiency  Objective: Vital signs in last 24 hours: Temp:  [97.7 F (36.5 C)-98.3 F (36.8 C)] 97.8 F (36.6 C) (03/26 0525) Pulse Rate:  [50-56] 52 (03/26 0525) Resp:  [16-20] 20 (03/26 0525) BP: (107-121)/(56-68) 121/68 mmHg (03/26 0525) SpO2:  [97 %-100 %] 97 % (03/26 0525) Weight:  [73.573 kg (162 lb 3.2 oz)] 73.573 kg (162 lb 3.2 oz) (03/26 0610) Weight change: 3.179 kg (7 lb 0.2 oz) Last BM Date: 11/16/14  Intake/Output from previous day: 03/25 0701 - 03/26 0700 In: 720 [P.O.:720] Out: 250 [Urine:250] Intake/Output this shift:    Physical Exam: Gen. appearance-the patient is alert and oriented  HEENT negative  Neck supple no JVD or thyroid abnormalities  Heart regular rhythm no murmurs  Lungs clear to P&A tenderness over the right rib cage  Abdomen no palpable organs or masses  Extremities skin tear right hand-slight tenderness over the hip on left normal range of motion   Recent Labs  11/17/14 0639 11/18/14 0457  WBC 4.5 4.4  HGB 10.6* 10.5*  HCT 32.5* 32.9*  PLT 149* 157   BMET  Recent Labs  11/17/14 0639 11/18/14 0457  NA 138 138  K 4.8 5.1  CL 110 112  CO2 25 21  GLUCOSE 86 80  BUN 39* 39*  CREATININE 1.93* 1.79*  CALCIUM 7.7* 7.9*    Studies/Results: [place] Venous Img Lower Bilateral  11/16/2014   CLINICAL DATA:  Bilateral lower extremity edema.  EXAM: BILATERAL LOWER EXTREMITY VENOUS DOPPLER ULTRASOUND  TECHNIQUE: Gray-scale sonography with graded compression, as well as color Doppler and duplex ultrasound were performed to evaluate the lower extremity deep venous systems from the level of the common femoral vein and including the common femoral, femoral, profunda femoral, popliteal and calf veins including the posterior tibial, peroneal  and gastrocnemius veins when visible. The superficial great saphenous vein was also interrogated. Spectral Doppler was utilized to evaluate flow at rest and with distal augmentation maneuvers in the common femoral, femoral and popliteal veins.  COMPARISON:  None.  FINDINGS: RIGHT LOWER EXTREMITY  Common Femoral Vein: No evidence of thrombus. Normal compressibility, respiratory phasicity and response to augmentation.  Saphenofemoral Junction: No evidence of thrombus. Normal compressibility and flow on color Doppler imaging.  Profunda Femoral Vein: No evidence of thrombus. Normal compressibility and flow on color Doppler imaging.  Femoral Vein: No evidence of thrombus. Normal compressibility, respiratory phasicity and response to augmentation.  Popliteal Vein: No evidence of thrombus. Normal compressibility, respiratory phasicity and response to augmentation.  Calf Veins: No evidence of thrombus. Normal compressibility and flow on color Doppler imaging.  Superficial Great Saphenous Vein: No evidence of thrombus. Normal compressibility and flow on color Doppler imaging.  Venous Reflux:  None.  Other Findings: No evidence of superficial thrombophlebitis or abnormal fluid collection.  LEFT LOWER EXTREMITY  Common Femoral Vein: No evidence of thrombus. Normal compressibility, respiratory phasicity and response to augmentation.  Saphenofemoral Junction: No evidence of thrombus. Normal compressibility and flow on color Doppler imaging.  Profunda Femoral Vein: No evidence of thrombus. Normal compressibility and flow on color Doppler imaging.  Femoral Vein: No evidence of thrombus. Normal compressibility, respiratory phasicity and response to augmentation.  Popliteal Vein: No evidence of thrombus. Normal compressibility, respiratory phasicity and response to augmentation.  Calf Veins: No evidence  of thrombus. Normal compressibility and flow on color Doppler imaging.  Superficial Great Saphenous Vein: No evidence of thrombus.  Normal compressibility and flow on color Doppler imaging.  Venous Reflux:  None.  Other Findings: No evidence of superficial thrombophlebitis or abnormal fluid collection.  IMPRESSION: No evidence of bilateral lower extremity deep venous thrombosis.   Electronically Signed   By: Irish Lack M.D.   On: 11/16/2014 16:15    Medications:  . aspirin EC  81 mg Oral Daily  . cefTRIAXone (ROCEPHIN)  IV  1 g Intravenous Q24H  . feeding supplement (ENSURE ENLIVE)  237 mL Oral BID BM  . heparin  5,000 Units Subcutaneous 3 times per day  . simvastatin  20 mg Oral Daily    . 0.9 % NaCl with KCl 20 mEq / L 125 mL/hr at 11/18/14 0981     Assessment/Plan: 1. Rib fracture secondary to fall on right-plan to continue comfort measures and pain control  2. Urinary tract infection-plan to continue IV Rocephin  3. CK D at baseline  4. Hyponatremia plan to monitor chemistries  5. Hypocalcemia to repeat chemistries   LOS: 2 days   [name] G 11/18/2014, 7:49 AM

## 2014-11-19 LAB — CBC WITH DIFFERENTIAL/PLATELET
BASOS ABS: 0 10*3/uL (ref 0.0–0.1)
Basophils Relative: 0 % (ref 0–1)
EOS PCT: 3 % (ref 0–5)
Eosinophils Absolute: 0.1 10*3/uL (ref 0.0–0.7)
HCT: 32.5 % — ABNORMAL LOW (ref 39.0–52.0)
HEMOGLOBIN: 10.4 g/dL — AB (ref 13.0–17.0)
LYMPHS PCT: 23 % (ref 12–46)
Lymphs Abs: 1 10*3/uL (ref 0.7–4.0)
MCH: 33.8 pg (ref 26.0–34.0)
MCHC: 32 g/dL (ref 30.0–36.0)
MCV: 105.5 fL — AB (ref 78.0–100.0)
Monocytes Absolute: 0.4 10*3/uL (ref 0.1–1.0)
Monocytes Relative: 10 % (ref 3–12)
NEUTROS ABS: 2.7 10*3/uL (ref 1.7–7.7)
NEUTROS PCT: 64 % (ref 43–77)
Platelets: 162 10*3/uL (ref 150–400)
RBC: 3.08 MIL/uL — ABNORMAL LOW (ref 4.22–5.81)
RDW: 14.1 % (ref 11.5–15.5)
WBC: 4.2 10*3/uL (ref 4.0–10.5)

## 2014-11-19 LAB — COMPREHENSIVE METABOLIC PANEL
ALT: 8 U/L (ref 0–53)
AST: 18 U/L (ref 0–37)
Albumin: 2.3 g/dL — ABNORMAL LOW (ref 3.5–5.2)
Alkaline Phosphatase: 76 U/L (ref 39–117)
Anion gap: 3 — ABNORMAL LOW (ref 5–15)
BUN: 36 mg/dL — ABNORMAL HIGH (ref 6–23)
CALCIUM: 8 mg/dL — AB (ref 8.4–10.5)
CO2: 21 mmol/L (ref 19–32)
Chloride: 113 mmol/L — ABNORMAL HIGH (ref 96–112)
Creatinine, Ser: 1.73 mg/dL — ABNORMAL HIGH (ref 0.50–1.35)
GFR calc Af Amer: 36 mL/min — ABNORMAL LOW (ref >90)
GFR calc non Af Amer: 31 mL/min — ABNORMAL LOW (ref >90)
Glucose, Bld: 88 mg/dL (ref 70–99)
POTASSIUM: 5.1 mmol/L (ref 3.5–5.1)
Sodium: 137 mmol/L (ref 135–145)
Total Bilirubin: 0.5 mg/dL (ref 0.3–1.2)
Total Protein: 6 g/dL (ref 6.0–8.3)

## 2014-11-19 LAB — CALCIUM, IONIZED: CALCIUM ION: 0.92 mmol/L — AB (ref 1.12–1.32)

## 2014-11-19 NOTE — Progress Notes (Signed)
Report was given to [name] at the [facility] she verbalized understanding and was told that if she had any further questions she could call me back until 7 pm.  The patient was given a bath and the IV was d/c (site WNL).  The family was kept up to par on his plan for transfer, family was at the bedside.  Patient was transferred over via bed with staff and packet in stable condition.

## 2014-11-19 NOTE — Discharge Summary (Signed)
Physician Discharge Summary  [name] ZOX:[id number] DOB: December 21, 1911 DOA: 11/15/2014  PCP: Alice Reichert, MD  Admit date: 11/15/2014 Discharge date: 11/19/2014     Discharge Diagnoses:  1. Fall at home with laceration of right hand and contusion left hip also fractures of the right fifth through eighth ribs 2. Urinary tract infection Morganella morganii 3. Chronic renal third stage II 4. Hyperlipidemia 5. Iron deficiency anemia 6. Hypocalcemia 7.  8.  9.  10.  11.   Discharge Condition: Stable Disposition: Discharged to skilled nursing facility  Diet recommendation: 2 g sodium diet low-cholesterol  Filed Weights   11/17/14 0634 11/18/14 0610 11/19/14 0500  Weight: 70.394 kg (155 lb 3.1 oz) 73.573 kg (162 lb 3.2 oz) 77 kg (169 lb 12.1 oz)    History of present illness:  The patient was brought to the emergency room initially after having fallen from bed at home. He injured his right hand and knee and possibly help. Apparently the family stated that he had foul-smelling urine recently. He is noted to have serum sodium of 133 potassium 3.4 creatinine 2.16 x-rays on admission revealed fractures of the right anterolateral fifth through eighth ribs. The patient was placed on intravenous fluids and admitted to [facility] bed  Hospital Course:  Cultures were obtained of urine on admission and subsequently cultured Morganella morganii. The patient was maintained on 0.9% sodium chloride with supplementary potassium. He was started on IV Rocephin 1 g every 24 hours heparin injection 5000 units 3 times a day was given morphine 2 mg every 3-4 hours when necessary for pain and was continued on Zocor 20 mg daily. Patient remained fairly alert throughout his hospital stay the wound on his hand was dressed with Bactroban cream. He was continued on aspirin 81 mg daily first sulfate 325 mg twice a day and furosemide 40 mg twice a day along with food supplements. Social service work with this  patient again placed in skilled facility for rehabilitation. This was agreeable with family members. The patient remained stable. Except for the rib fractures he had no other fractures   Discharge Instructions The patient is to continue medications listed below. Bactroban cream to be applied to the wound on his hand and dressings changed daily. He should received Ensure or boost daily as nutritional supplement. He may be able to with help from physical therapy to move about in the room and walk with help    Medication List    TAKE these medications        aspirin EC 81 MG tablet  Take 81 mg by mouth daily.     ferrous sulfate 325 (65 FE) MG tablet  Take 325 mg by mouth 2 (two) times daily.     furosemide 40 MG tablet  Commonly known as:  LASIX  Take 40-60 mg by mouth 2 (two) times daily. 60mg  in the Am and 40mg  in the Pm.     potassium chloride SA 20 MEQ tablet  Commonly known as:  K-DUR,KLOR-CON  Take 20 mEq by mouth 2 (two) times daily.     simvastatin 20 MG tablet  Commonly known as:  ZOCOR  Take 20 mg by mouth daily.       No Known Allergies  The results of significant diagnostics from this hospitalization (including imaging, microbiology, ancillary and laboratory) are listed below for reference.    Significant Diagnostic Studies: Dg Ribs Unilateral W/chest Right  11/15/2014   CLINICAL DATA:  Status post fall from wheelchair onto floor.  Right anterior and lateral rib pain. Initial encounter.  EXAM: RIGHT RIBS AND CHEST - 3+ VIEW  COMPARISON:  Chest radiograph performed 03/04/2010  FINDINGS: Fractures through the right anterolateral fifth through eighth ribs are suspected, though some of these may be chronic in nature.  Patchy bilateral atelectasis is noted, with underlying scarring. Increased interstitial markings are likely transient in nature. No definite pleural effusion or pneumothorax is seen.  The cardiomediastinal silhouette is enlarged. The patient is status post  vertebroplasty at multiple levels along the thoracic spine.  IMPRESSION: 1. Fractures through the right anterolateral fifth through eighth ribs suspected, though some of these may be chronic in nature. 2. Patchy bilateral atelectasis, with underlying scarring. Increased interstitial markings are likely transient in nature. 3. Cardiomegaly noted.   Electronically Signed   By: Roanna Raider M.D.   On: 11/15/2014 21:38   Ct Head Wo Contrast  11/15/2014   CLINICAL DATA:  Fall from wheelchair yesterday, dementia  EXAM: CT HEAD WITHOUT CONTRAST  CT CERVICAL SPINE WITHOUT CONTRAST  TECHNIQUE: Multidetector CT imaging of the head and cervical spine was performed following the standard protocol without intravenous contrast. Multiplanar CT image reconstructions of the cervical spine were also generated.  COMPARISON:  11/20/2013  FINDINGS: CT HEAD FINDINGS  No skull fracture is noted. Paranasal sinuses and mastoid air cells are unremarkable. Atherosclerotic calcifications of carotid siphon are again noted. No intracranial hemorrhage, mass effect or midline shift. Moderate cerebral atrophy. Extensive periventricular and subcortical white matter decreased attenuation consistent with chronic small vessel ischemic changes again noted.  CT CERVICAL SPINE FINDINGS  Axial images of the cervical spine shows no acute fracture or subluxation. There is diffuse osteopenia. There is no pneumothorax in visualized lung apices. Computer processed images shows no acute fracture or subluxation. Degenerative changes are noted C1-C2 articulation. There is mild disc space flattening with minimal posterior spurring at C3-C4 level. Mild disc space flattening at C4-C5 and C5-C6 level. Multilevel facet degenerative changes are noted. Mild anterior spurring lower endplate of C6 vertebral body. There is probable chronic mild compression deformity upper endplate of T1 vertebral body. No definite evidence of acute fracture.  No prevertebral soft  tissue swelling.  Cervical airway is patent.  IMPRESSION: 1. No acute intracranial abnormality. Moderate cerebral atrophy. Extensive chronic white matter disease. No definite acute cortical infarction. 2. No cervical spine acute fracture or subluxation. Diffuse osteopenia. Multilevel degenerative changes as described above. Probable chronic mild compression deformity upper endplate of T1 vertebral body.   Electronically Signed   By: Natasha Mead M.D.   On: 11/15/2014 21:50   Ct Cervical Spine Wo Contrast  11/15/2014   CLINICAL DATA:  Fall from wheelchair yesterday, dementia  EXAM: CT HEAD WITHOUT CONTRAST  CT CERVICAL SPINE WITHOUT CONTRAST  TECHNIQUE: Multidetector CT imaging of the head and cervical spine was performed following the standard protocol without intravenous contrast. Multiplanar CT image reconstructions of the cervical spine were also generated.  COMPARISON:  11/20/2013  FINDINGS: CT HEAD FINDINGS  No skull fracture is noted. Paranasal sinuses and mastoid air cells are unremarkable. Atherosclerotic calcifications of carotid siphon are again noted. No intracranial hemorrhage, mass effect or midline shift. Moderate cerebral atrophy. Extensive periventricular and subcortical white matter decreased attenuation consistent with chronic small vessel ischemic changes again noted.  CT CERVICAL SPINE FINDINGS  Axial images of the cervical spine shows no acute fracture or subluxation. There is diffuse osteopenia. There is no pneumothorax in visualized lung apices. Computer processed images shows no acute  fracture or subluxation. Degenerative changes are noted C1-C2 articulation. There is mild disc space flattening with minimal posterior spurring at C3-C4 level. Mild disc space flattening at C4-C5 and C5-C6 level. Multilevel facet degenerative changes are noted. Mild anterior spurring lower endplate of C6 vertebral body. There is probable chronic mild compression deformity upper endplate of T1 vertebral body. No  definite evidence of acute fracture.  No prevertebral soft tissue swelling.  Cervical airway is patent.  IMPRESSION: 1. No acute intracranial abnormality. Moderate cerebral atrophy. Extensive chronic white matter disease. No definite acute cortical infarction. 2. No cervical spine acute fracture or subluxation. Diffuse osteopenia. Multilevel degenerative changes as described above. Probable chronic mild compression deformity upper endplate of T1 vertebral body.   Electronically Signed   By: Natasha Mead M.D.   On: 11/15/2014 21:50   US Venous Img Lower Bilateral  11/16/2014   CLINICAL DATA:  Bilateral lower extremity edema.  EXAM: BILATERAL LOWER EXTREMITY VENOUS DOPPLER ULTRASOUND  TECHNIQUE: Gray-scale sonography with graded compression, as well as color Doppler and duplex ultrasound were performed to evaluate the lower extremity deep venous systems from the level of the common femoral vein and including the common femoral, femoral, profunda femoral, popliteal and calf veins including the posterior tibial, peroneal and gastrocnemius veins when visible. The superficial great saphenous vein was also interrogated. Spectral Doppler was utilized to evaluate flow at rest and with distal augmentation maneuvers in the common femoral, femoral and popliteal veins.  COMPARISON:  None.  FINDINGS: RIGHT LOWER EXTREMITY  Common Femoral Vein: No evidence of thrombus. Normal compressibility, respiratory phasicity and response to augmentation.  Saphenofemoral Junction: No evidence of thrombus. Normal compressibility and flow on color Doppler imaging.  Profunda Femoral Vein: No evidence of thrombus. Normal compressibility and flow on color Doppler imaging.  Femoral Vein: No evidence of thrombus. Normal compressibility, respiratory phasicity and response to augmentation.  Popliteal Vein: No evidence of thrombus. Normal compressibility, respiratory phasicity and response to augmentation.  Calf Veins: No evidence of thrombus. Normal  compressibility and flow on color Doppler imaging.  Superficial Great Saphenous Vein: No evidence of thrombus. Normal compressibility and flow on color Doppler imaging.  Venous Reflux:  None.  Other Findings: No evidence of superficial thrombophlebitis or abnormal fluid collection.  LEFT LOWER EXTREMITY  Common Femoral Vein: No evidence of thrombus. Normal compressibility, respiratory phasicity and response to augmentation.  Saphenofemoral Junction: No evidence of thrombus. Normal compressibility and flow on color Doppler imaging.  Profunda Femoral Vein: No evidence of thrombus. Normal compressibility and flow on color Doppler imaging.  Femoral Vein: No evidence of thrombus. Normal compressibility, respiratory phasicity and response to augmentation.  Popliteal Vein: No evidence of thrombus. Normal compressibility, respiratory phasicity and response to augmentation.  Calf Veins: No evidence of thrombus. Normal compressibility and flow on color Doppler imaging.  Superficial Great Saphenous Vein: No evidence of thrombus. Normal compressibility and flow on color Doppler imaging.  Venous Reflux:  None.  Other Findings: No evidence of superficial thrombophlebitis or abnormal fluid collection.  IMPRESSION: No evidence of bilateral lower extremity deep venous thrombosis.   Electronically Signed   By: Irish Lack M.D.   On: 11/16/2014 16:15   Dg Knee Complete 4 Views Left  11/16/2014   CLINICAL DATA:  One 81 year old male post fall at home. Initial encounter.  EXAM: LEFT KNEE - COMPLETE 4+ VIEW  COMPARISON:  None.  FINDINGS: The bones are under mineralized. There is medial and lateral tibial femoral joint space narrowing and  chondrocalcinosis. Mild spurring of the patella. No acute fracture. No joint effusion. Vascular calcifications are seen.  IMPRESSION: Degenerative change of the left knee without acute fracture.   Electronically Signed   By: Rubye Oaks M.D.   On: 11/16/2014 01:06   Dg Knee Complete 4  Views Right  11/15/2014   CLINICAL DATA:  Larey Seat from wheelchair to floor, with right knee pain. Initial encounter.  EXAM: RIGHT KNEE - COMPLETE 4+ VIEW  COMPARISON:  Right knee radiographs performed 03/14/2009  FINDINGS: There is no evidence of fracture or dislocation. The joint spaces are preserved. Small marginal osteophytes are seen at the lateral and patellofemoral compartments.  Trace knee joint fluid remains within normal limits. Scattered vascular calcifications are seen.  IMPRESSION: 1. No evidence of fracture or dislocation. 2. Minimal osteoarthritis at the right knee. 3. Scattered vascular calcifications seen.   Electronically Signed   By: Roanna Raider M.D.   On: 11/15/2014 22:19   Dg Hand Complete Right  11/15/2014   CLINICAL DATA:  Larey Seat from wheelchair onto floor, with right hand pain and skin tear between the thumb and index finger. Initial encounter.  EXAM: RIGHT HAND - COMPLETE 3+ VIEW  COMPARISON:  None.  FINDINGS: The known soft tissue laceration is difficult to fully characterize on radiograph. No radiopaque foreign bodies are seen.  There is diffuse osteopenia of visualized osseous structures. Degenerative change is noted at the first carpometacarpal joint, and mild degenerative change is noted at the proximal carpal row. Calcification of the triangular fibrocartilage is noted. There is no evidence of fracture or dislocation.  IMPRESSION: 1. No radiopaque foreign bodies seen. 2. Diffuse osteopenia of visualized osseous structures. 3. No evidence of fracture or dislocation. 4. Osteoarthritis of the right hand, with calcification of the triangular fibrocartilage.   Electronically Signed   By: Roanna Raider M.D.   On: 11/15/2014 22:12   Dg Hip Unilat With Pelvis 2-3 Views Left  11/16/2014   CLINICAL DATA:  One 81 year old male post fall at home, initial encounter.  EXAM: LEFT HIP (WITH PELVIS) 2-3 VIEWS  COMPARISON:  None.  FINDINGS: The bones are significantly under mineralized.  Postsurgical change with lateral plate and screw fixation of the right hip. The cortical margins of the bony pelvis and left hip are intact. No displaced fracture. Pubic symphysis and sacroiliac joints are congruent. Both femoral heads are well-seated in the respective acetabula.  IMPRESSION: No displaced left hip fracture. The bones are significantly under mineralized. If there is high clinical suspicion for occult hip fracture or the patient refuses to weightbear, consider further evaluation with MRI. Although CT is expeditious, evidence is lacking regarding accuracy of CT over plain film radiography.   Electronically Signed   By: Rubye Oaks M.D.   On: 11/16/2014 01:05    Microbiology: Recent Results (from the past 240 hour(s))  Urine culture     Status: None   Collection Time: 11/15/14  9:30 PM  Result Value Ref Range Status   Specimen Description URINE, CLEAN CATCH  Final   Special Requests NONE  Final   Colony Count   Final    >=100,000 COLONIES/ML Performed at [facility]    Culture   Final    [facility] MORGANII Performed at [facility]    Report Status 11/18/2014 FINAL  Final   Organism ID, Bacteria MORGANELLA MORGANII  Final      Susceptibility   Morganella morganii - MIC*    AMPICILLIN >=32 RESISTANT Resistant  CEFAZOLIN >=64 RESISTANT Resistant     CEFTRIAXONE <=1 SENSITIVE Sensitive     CIPROFLOXACIN <=0.25 SENSITIVE Sensitive     GENTAMICIN <=1 SENSITIVE Sensitive     LEVOFLOXACIN <=0.12 SENSITIVE Sensitive     NITROFURANTOIN 128 RESISTANT Resistant     TOBRAMYCIN <=1 SENSITIVE Sensitive     TRIMETH/SULFA <=20 SENSITIVE Sensitive     PIP/TAZO <=4 SENSITIVE Sensitive     [facility] MORGANII     Labs: Basic Metabolic Panel:  Recent Labs Lab 11/15/14 2023 11/16/14 0643 11/16/14 0645 11/17/14 0639 11/18/14 0457 11/18/14 1409  NA 133*  --  136 138 138  --   K 3.4*  --  4.4 4.8 5.1  --   CL 100  --  105 110 112  --   CO2 27  --  26  25 21   --   GLUCOSE 85  --  82 86 80  --   BUN 35*  --  36* 39* 39*  --   CREATININE 2.16*  --  2.03* 1.93* 1.79*  --   CALCIUM 7.9*  --  7.7* 7.7* 7.9*  --   MG  --  2.3  --   --   --   --   PHOS  --   --   --   --   --  2.5   Liver Function Tests:  Recent Labs Lab 11/16/14 0645 11/17/14 0639 11/18/14 0457  AST 16 15 15   ALT 9 7 7   ALKPHOS 82 79 75  BILITOT 0.9 0.8 0.5  PROT 6.4 6.0 5.7*  ALBUMIN 2.6* 2.4* 2.2*   No results for input(s): LIPASE, AMYLASE in the last 168 hours. No results for input(s): AMMONIA in the last 168 hours. CBC:  Recent Labs Lab 11/15/14 2029 11/16/14 0645 11/17/14 0639 11/18/14 0457 11/19/14 0550  WBC 5.2 4.6 4.5 4.4 4.2  NEUTROABS 3.5 2.7 2.8 2.7 2.7  HGB 11.7* 11.0* 10.6* 10.5* 10.4*  HCT 35.9* 33.5* 32.5* 32.9* 32.5*  MCV 103.5* 103.1* 104.5* 105.1* 105.5*  PLT 167 146* 149* 157 162   Cardiac Enzymes: No results for input(s): CKTOTAL, CKMB, CKMBINDEX, TROPONINI in the last 168 hours. BNP: BNP (last 3 results) No results for input(s): BNP in the last 8760 hours.  ProBNP (last 3 results) No results for input(s): PROBNP in the last 8760 hours.  CBG: No results for input(s): GLUCAP in the last 168 hours.  Active Problems:   Rib fracture   Fall   UTI (lower urinary tract infection)   UTI (urinary tract infection)   Time coordinating discharge: 45 minutes  Signed:  Butch Penny, MD 11/19/2014, 7:21 AM Physician Discharge Summary  [name] ZOX:[id number] DOB: 07-09-1912 DOA: 11/15/2014  PCP: Alice Reichert, MD  Admit date: 11/15/2014 Discharge date: 11/19/2014     Discharge Diagnoses:  12.   Discharge Condition:  Disposition:   Diet recommendation:   Filed Weights   11/17/14 0634 11/18/14 0610 11/19/14 0500  Weight: 70.394 kg (155 lb 3.1 oz) 73.573 kg (162 lb 3.2 oz) 77 kg (169 lb 12.1 oz)    History of present illness:    Hospital Course:     Discharge Instructions     Medication List     TAKE these medications        aspirin EC 81 MG tablet  Take 81 mg by mouth daily.     ferrous sulfate 325 (65 FE) MG tablet  Take 325 mg by mouth 2 (two) times daily.  furosemide 40 MG tablet  Commonly known as:  LASIX  Take 40-60 mg by mouth 2 (two) times daily. 60mg  in the Am and 40mg  in the Pm.     potassium chloride SA 20 MEQ tablet  Commonly known as:  K-DUR,KLOR-CON  Take 20 mEq by mouth 2 (two) times daily.     simvastatin 20 MG tablet  Commonly known as:  ZOCOR  Take 20 mg by mouth daily.       No Known Allergies  The results of significant diagnostics from this hospitalization (including imaging, microbiology, ancillary and laboratory) are listed below for reference.    Significant Diagnostic Studies: Dg Ribs Unilateral W/chest Right  11/15/2014   CLINICAL DATA:  Status post fall from wheelchair onto floor. Right anterior and lateral rib pain. Initial encounter.  EXAM: RIGHT RIBS AND CHEST - 3+ VIEW  COMPARISON:  Chest radiograph performed 03/04/2010  FINDINGS: Fractures through the right anterolateral fifth through eighth ribs are suspected, though some of these may be chronic in nature.  Patchy bilateral atelectasis is noted, with underlying scarring. Increased interstitial markings are likely transient in nature. No definite pleural effusion or pneumothorax is seen.  The cardiomediastinal silhouette is enlarged. The patient is status post vertebroplasty at multiple levels along the thoracic spine.  IMPRESSION: 1. Fractures through the right anterolateral fifth through eighth ribs suspected, though some of these may be chronic in nature. 2. Patchy bilateral atelectasis, with underlying scarring. Increased interstitial markings are likely transient in nature. 3. Cardiomegaly noted.   Electronically Signed   By: Roanna Raider M.D.   On: 11/15/2014 21:38   Ct Head Wo Contrast  11/15/2014   CLINICAL DATA:  Fall from wheelchair yesterday, dementia  EXAM: CT HEAD WITHOUT  CONTRAST  CT CERVICAL SPINE WITHOUT CONTRAST  TECHNIQUE: Multidetector CT imaging of the head and cervical spine was performed following the standard protocol without intravenous contrast. Multiplanar CT image reconstructions of the cervical spine were also generated.  COMPARISON:  11/20/2013  FINDINGS: CT HEAD FINDINGS  No skull fracture is noted. Paranasal sinuses and mastoid air cells are unremarkable. Atherosclerotic calcifications of carotid siphon are again noted. No intracranial hemorrhage, mass effect or midline shift. Moderate cerebral atrophy. Extensive periventricular and subcortical white matter decreased attenuation consistent with chronic small vessel ischemic changes again noted.  CT CERVICAL SPINE FINDINGS  Axial images of the cervical spine shows no acute fracture or subluxation. There is diffuse osteopenia. There is no pneumothorax in visualized lung apices. Computer processed images shows no acute fracture or subluxation. Degenerative changes are noted C1-C2 articulation. There is mild disc space flattening with minimal posterior spurring at C3-C4 level. Mild disc space flattening at C4-C5 and C5-C6 level. Multilevel facet degenerative changes are noted. Mild anterior spurring lower endplate of C6 vertebral body. There is probable chronic mild compression deformity upper endplate of T1 vertebral body. No definite evidence of acute fracture.  No prevertebral soft tissue swelling.  Cervical airway is patent.  IMPRESSION: 1. No acute intracranial abnormality. Moderate cerebral atrophy. Extensive chronic white matter disease. No definite acute cortical infarction. 2. No cervical spine acute fracture or subluxation. Diffuse osteopenia. Multilevel degenerative changes as described above. Probable chronic mild compression deformity upper endplate of T1 vertebral body.   Electronically Signed   By: Natasha Mead M.D.   On: 11/15/2014 21:50   Ct Cervical Spine Wo Contrast  11/15/2014   CLINICAL DATA:  Fall  from wheelchair yesterday, dementia  EXAM: CT HEAD WITHOUT CONTRAST  CT  CERVICAL SPINE WITHOUT CONTRAST  TECHNIQUE: Multidetector CT imaging of the head and cervical spine was performed following the standard protocol without intravenous contrast. Multiplanar CT image reconstructions of the cervical spine were also generated.  COMPARISON:  11/20/2013  FINDINGS: CT HEAD FINDINGS  No skull fracture is noted. Paranasal sinuses and mastoid air cells are unremarkable. Atherosclerotic calcifications of carotid siphon are again noted. No intracranial hemorrhage, mass effect or midline shift. Moderate cerebral atrophy. Extensive periventricular and subcortical white matter decreased attenuation consistent with chronic small vessel ischemic changes again noted.  CT CERVICAL SPINE FINDINGS  Axial images of the cervical spine shows no acute fracture or subluxation. There is diffuse osteopenia. There is no pneumothorax in visualized lung apices. Computer processed images shows no acute fracture or subluxation. Degenerative changes are noted C1-C2 articulation. There is mild disc space flattening with minimal posterior spurring at C3-C4 level. Mild disc space flattening at C4-C5 and C5-C6 level. Multilevel facet degenerative changes are noted. Mild anterior spurring lower endplate of C6 vertebral body. There is probable chronic mild compression deformity upper endplate of T1 vertebral body. No definite evidence of acute fracture.  No prevertebral soft tissue swelling.  Cervical airway is patent.  IMPRESSION: 1. No acute intracranial abnormality. Moderate cerebral atrophy. Extensive chronic white matter disease. No definite acute cortical infarction. 2. No cervical spine acute fracture or subluxation. Diffuse osteopenia. Multilevel degenerative changes as described above. Probable chronic mild compression deformity upper endplate of T1 vertebral body.   Electronically Signed   By: Natasha Mead M.D.   On: 11/15/2014 21:50   US  Venous Img Lower Bilateral  11/16/2014   CLINICAL DATA:  Bilateral lower extremity edema.  EXAM: BILATERAL LOWER EXTREMITY VENOUS DOPPLER ULTRASOUND  TECHNIQUE: Gray-scale sonography with graded compression, as well as color Doppler and duplex ultrasound were performed to evaluate the lower extremity deep venous systems from the level of the common femoral vein and including the common femoral, femoral, profunda femoral, popliteal and calf veins including the posterior tibial, peroneal and gastrocnemius veins when visible. The superficial great saphenous vein was also interrogated. Spectral Doppler was utilized to evaluate flow at rest and with distal augmentation maneuvers in the common femoral, femoral and popliteal veins.  COMPARISON:  None.  FINDINGS: RIGHT LOWER EXTREMITY  Common Femoral Vein: No evidence of thrombus. Normal compressibility, respiratory phasicity and response to augmentation.  Saphenofemoral Junction: No evidence of thrombus. Normal compressibility and flow on color Doppler imaging.  Profunda Femoral Vein: No evidence of thrombus. Normal compressibility and flow on color Doppler imaging.  Femoral Vein: No evidence of thrombus. Normal compressibility, respiratory phasicity and response to augmentation.  Popliteal Vein: No evidence of thrombus. Normal compressibility, respiratory phasicity and response to augmentation.  Calf Veins: No evidence of thrombus. Normal compressibility and flow on color Doppler imaging.  Superficial Great Saphenous Vein: No evidence of thrombus. Normal compressibility and flow on color Doppler imaging.  Venous Reflux:  None.  Other Findings: No evidence of superficial thrombophlebitis or abnormal fluid collection.  LEFT LOWER EXTREMITY  Common Femoral Vein: No evidence of thrombus. Normal compressibility, respiratory phasicity and response to augmentation.  Saphenofemoral Junction: No evidence of thrombus. Normal compressibility and flow on color Doppler imaging.   Profunda Femoral Vein: No evidence of thrombus. Normal compressibility and flow on color Doppler imaging.  Femoral Vein: No evidence of thrombus. Normal compressibility, respiratory phasicity and response to augmentation.  Popliteal Vein: No evidence of thrombus. Normal compressibility, respiratory phasicity and response to augmentation.  Calf  Veins: No evidence of thrombus. Normal compressibility and flow on color Doppler imaging.  Superficial Great Saphenous Vein: No evidence of thrombus. Normal compressibility and flow on color Doppler imaging.  Venous Reflux:  None.  Other Findings: No evidence of superficial thrombophlebitis or abnormal fluid collection.  IMPRESSION: No evidence of bilateral lower extremity deep venous thrombosis.   Electronically Signed   By: Irish LackGlenn  Yamagata M.D.   On: 11/16/2014 16:15   Dg Knee Complete 4 Views Left  11/16/2014   CLINICAL DATA:  One 74hundred year old male post fall at home. Initial encounter.  EXAM: LEFT KNEE - COMPLETE 4+ VIEW  COMPARISON:  None.  FINDINGS: The bones are under mineralized. There is medial and lateral tibial femoral joint space narrowing and chondrocalcinosis. Mild spurring of the patella. No acute fracture. No joint effusion. Vascular calcifications are seen.  IMPRESSION: Degenerative change of the left knee without acute fracture.   Electronically Signed   By: Rubye OaksMelanie  Ehinger M.D.   On: 11/16/2014 01:06   Dg Knee Complete 4 Views Right  11/15/2014   CLINICAL DATA:  Larey SeatFell from wheelchair to floor, with right knee pain. Initial encounter.  EXAM: RIGHT KNEE - COMPLETE 4+ VIEW  COMPARISON:  Right knee radiographs performed 03/14/2009  FINDINGS: There is no evidence of fracture or dislocation. The joint spaces are preserved. Small marginal osteophytes are seen at the lateral and patellofemoral compartments.  Trace knee joint fluid remains within normal limits. Scattered vascular calcifications are seen.  IMPRESSION: 1. No evidence of fracture or  dislocation. 2. Minimal osteoarthritis at the right knee. 3. Scattered vascular calcifications seen.   Electronically Signed   By: Roanna RaiderJeffery  Chang M.D.   On: 11/15/2014 22:19   Dg Hand Complete Right  11/15/2014   CLINICAL DATA:  Larey SeatFell from wheelchair onto floor, with right hand pain and skin tear between the thumb and index finger. Initial encounter.  EXAM: RIGHT HAND - COMPLETE 3+ VIEW  COMPARISON:  None.  FINDINGS: The known soft tissue laceration is difficult to fully characterize on radiograph. No radiopaque foreign bodies are seen.  There is diffuse osteopenia of visualized osseous structures. Degenerative change is noted at the first carpometacarpal joint, and mild degenerative change is noted at the proximal carpal row. Calcification of the triangular fibrocartilage is noted. There is no evidence of fracture or dislocation.  IMPRESSION: 1. No radiopaque foreign bodies seen. 2. Diffuse osteopenia of visualized osseous structures. 3. No evidence of fracture or dislocation. 4. Osteoarthritis of the right hand, with calcification of the triangular fibrocartilage.   Electronically Signed   By: Roanna RaiderJeffery  Chang M.D.   On: 11/15/2014 22:12   Dg Hip Unilat With Pelvis 2-3 Views Left  11/16/2014   CLINICAL DATA:  One 78hundred year old male post fall at home, initial encounter.  EXAM: LEFT HIP (WITH PELVIS) 2-3 VIEWS  COMPARISON:  None.  FINDINGS: The bones are significantly under mineralized. Postsurgical change with lateral plate and screw fixation of the right hip. The cortical margins of the bony pelvis and left hip are intact. No displaced fracture. Pubic symphysis and sacroiliac joints are congruent. Both femoral heads are well-seated in the respective acetabula.  IMPRESSION: No displaced left hip fracture. The bones are significantly under mineralized. If there is high clinical suspicion for occult hip fracture or the patient refuses to weightbear, consider further evaluation with MRI. Although CT is  expeditious, evidence is lacking regarding accuracy of CT over plain film radiography.   Electronically Signed   By:  Rubye Oaks M.D.   On: 11/16/2014 01:05    Microbiology: Recent Results (from the past 240 hour(s))  Urine culture     Status: None   Collection Time: 11/15/14  9:30 PM  Result Value Ref Range Status   Specimen Description URINE, CLEAN CATCH  Final   Special Requests NONE  Final   Colony Count   Final    >=100,000 COLONIES/ML Performed at [facility]    Culture   Final    [facility] MORGANII Performed at [facility]    Report Status 11/18/2014 FINAL  Final   Organism ID, Bacteria MORGANELLA MORGANII  Final      Susceptibility   Morganella morganii - MIC*    AMPICILLIN >=32 RESISTANT Resistant     CEFAZOLIN >=64 RESISTANT Resistant     CEFTRIAXONE <=1 SENSITIVE Sensitive     CIPROFLOXACIN <=0.25 SENSITIVE Sensitive     GENTAMICIN <=1 SENSITIVE Sensitive     LEVOFLOXACIN <=0.12 SENSITIVE Sensitive     NITROFURANTOIN 128 RESISTANT Resistant     TOBRAMYCIN <=1 SENSITIVE Sensitive     TRIMETH/SULFA <=20 SENSITIVE Sensitive     PIP/TAZO <=4 SENSITIVE Sensitive     [facility] MORGANII     Labs: Basic Metabolic Panel:  Recent Labs Lab 11/15/14 2023 11/16/14 0643 11/16/14 0645 11/17/14 0639 11/18/14 0457 11/18/14 1409  NA 133*  --  136 138 138  --   K 3.4*  --  4.4 4.8 5.1  --   CL 100  --  105 110 112  --   CO2 27  --  26 25 21   --   GLUCOSE 85  --  82 86 80  --   BUN 35*  --  36* 39* 39*  --   CREATININE 2.16*  --  2.03* 1.93* 1.79*  --   CALCIUM 7.9*  --  7.7* 7.7* 7.9*  --   MG  --  2.3  --   --   --   --   PHOS  --   --   --   --   --  2.5   Liver Function Tests:  Recent Labs Lab 11/16/14 0645 11/17/14 0639 11/18/14 0457  AST 16 15 15   ALT 9 7 7   ALKPHOS 82 79 75  BILITOT 0.9 0.8 0.5  PROT 6.4 6.0 5.7*  ALBUMIN 2.6* 2.4* 2.2*   No results for input(s): LIPASE, AMYLASE in the last 168 hours. No results for  input(s): AMMONIA in the last 168 hours. CBC:  Recent Labs Lab 11/15/14 2029 11/16/14 0645 11/17/14 0639 11/18/14 0457 11/19/14 0550  WBC 5.2 4.6 4.5 4.4 4.2  NEUTROABS 3.5 2.7 2.8 2.7 2.7  HGB 11.7* 11.0* 10.6* 10.5* 10.4*  HCT 35.9* 33.5* 32.5* 32.9* 32.5*  MCV 103.5* 103.1* 104.5* 105.1* 105.5*  PLT 167 146* 149* 157 162   Cardiac Enzymes: No results for input(s): CKTOTAL, CKMB, CKMBINDEX, TROPONINI in the last 168 hours. BNP: BNP (last 3 results) No results for input(s): BNP in the last 8760 hours.  ProBNP (last 3 results) No results for input(s): PROBNP in the last 8760 hours.  CBG: No results for input(s): GLUCAP in the last 168 hours.  Active Problems:   Rib fracture   Fall   UTI (lower urinary tract infection)   UTI (urinary tract infection)   Time coordinating discharge:   Signed:  Butch Penny, MD 11/19/2014, 7:22 AM

## 2014-11-20 NOTE — Progress Notes (Signed)
UR chart review completed.  

## 2014-11-29 ENCOUNTER — Encounter (HOSPITAL_COMMUNITY): Payer: Self-pay | Admitting: Emergency Medicine

## 2014-11-29 ENCOUNTER — Emergency Department (HOSPITAL_COMMUNITY): Payer: Medicare Other

## 2014-11-29 ENCOUNTER — Inpatient Hospital Stay (HOSPITAL_COMMUNITY)
Admission: EM | Admit: 2014-11-29 | Discharge: 2014-12-04 | DRG: 291 | Disposition: A | Discharge status: Skilled Nursing Facility | Payer: Medicare Other | Attending: Family Medicine | Admitting: Family Medicine

## 2014-11-29 ENCOUNTER — Other Ambulatory Visit: Payer: Self-pay

## 2014-11-29 ENCOUNTER — Other Ambulatory Visit (HOSPITAL_COMMUNITY)
Admission: RE | Admit: 2014-11-29 | Discharge: 2014-11-29 | Disposition: A | Discharge status: Home / Self Care | Payer: Medicare Other | Source: Skilled Nursing Facility | Attending: Family Medicine | Admitting: Family Medicine

## 2014-11-29 DIAGNOSIS — E876 Hypokalemia: Secondary | ICD-10-CM | POA: Diagnosis present

## 2014-11-29 DIAGNOSIS — J180 Bronchopneumonia, unspecified organism: Secondary | ICD-10-CM | POA: Diagnosis present

## 2014-11-29 DIAGNOSIS — R5381 Other malaise: Secondary | ICD-10-CM

## 2014-11-29 DIAGNOSIS — E785 Hyperlipidemia, unspecified: Secondary | ICD-10-CM

## 2014-11-29 DIAGNOSIS — I5033 Acute on chronic diastolic (congestive) heart failure: Principal | ICD-10-CM | POA: Diagnosis present

## 2014-11-29 DIAGNOSIS — J181 Lobar pneumonia, unspecified organism: Secondary | ICD-10-CM

## 2014-11-29 DIAGNOSIS — N390 Urinary tract infection, site not specified: Secondary | ICD-10-CM | POA: Diagnosis present

## 2014-11-29 DIAGNOSIS — I482 Chronic atrial fibrillation, unspecified: Secondary | ICD-10-CM

## 2014-11-29 DIAGNOSIS — E860 Dehydration: Secondary | ICD-10-CM | POA: Diagnosis present

## 2014-11-29 DIAGNOSIS — S2239XA Fracture of one rib, unspecified side, initial encounter for closed fracture: Secondary | ICD-10-CM | POA: Diagnosis present

## 2014-11-29 DIAGNOSIS — E43 Unspecified severe protein-calorie malnutrition: Secondary | ICD-10-CM | POA: Diagnosis present

## 2014-11-29 DIAGNOSIS — E780 Pure hypercholesterolemia: Secondary | ICD-10-CM | POA: Diagnosis present

## 2014-11-29 DIAGNOSIS — D649 Anemia, unspecified: Secondary | ICD-10-CM

## 2014-11-29 DIAGNOSIS — Z66 Do not resuscitate: Secondary | ICD-10-CM | POA: Diagnosis present

## 2014-11-29 DIAGNOSIS — E870 Hyperosmolality and hypernatremia: Secondary | ICD-10-CM | POA: Diagnosis present

## 2014-11-29 DIAGNOSIS — N182 Chronic kidney disease, stage 2 (mild): Secondary | ICD-10-CM | POA: Diagnosis present

## 2014-11-29 DIAGNOSIS — F17220 Nicotine dependence, chewing tobacco, uncomplicated: Secondary | ICD-10-CM | POA: Diagnosis present

## 2014-11-29 DIAGNOSIS — R7989 Other specified abnormal findings of blood chemistry: Secondary | ICD-10-CM

## 2014-11-29 DIAGNOSIS — J189 Pneumonia, unspecified organism: Secondary | ICD-10-CM

## 2014-11-29 DIAGNOSIS — M7989 Other specified soft tissue disorders: Secondary | ICD-10-CM | POA: Diagnosis not present

## 2014-11-29 DIAGNOSIS — E8770 Fluid overload, unspecified: Secondary | ICD-10-CM

## 2014-11-29 DIAGNOSIS — N289 Disorder of kidney and ureter, unspecified: Secondary | ICD-10-CM

## 2014-11-29 DIAGNOSIS — Z6821 Body mass index (BMI) 21.0-21.9, adult: Secondary | ICD-10-CM | POA: Diagnosis not present

## 2014-11-29 DIAGNOSIS — I509 Heart failure, unspecified: Secondary | ICD-10-CM | POA: Diagnosis not present

## 2014-11-29 DIAGNOSIS — R778 Other specified abnormalities of plasma proteins: Secondary | ICD-10-CM

## 2014-11-29 DIAGNOSIS — E878 Other disorders of electrolyte and fluid balance, not elsewhere classified: Secondary | ICD-10-CM

## 2014-11-29 LAB — BASIC METABOLIC PANEL
ANION GAP: 7 (ref 5–15)
Anion gap: 8 (ref 5–15)
BUN: 47 mg/dL — AB (ref 6–23)
BUN: 46 mg/dL — ABNORMAL HIGH (ref 6–23)
CO2: 26 mmol/L (ref 19–32)
CO2: 24 mmol/L (ref 19–32)
CREATININE: 2.38 mg/dL — AB (ref 0.50–1.35)
Calcium: 8.1 mg/dL — ABNORMAL LOW (ref 8.4–10.5)
Calcium: 7.9 mg/dL — ABNORMAL LOW (ref 8.4–10.5)
Chloride: 117 mmol/L — ABNORMAL HIGH (ref 96–112)
Chloride: 118 mmol/L — ABNORMAL HIGH (ref 96–112)
Creatinine, Ser: 2.48 mg/dL — ABNORMAL HIGH (ref 0.50–1.35)
GFR calc Af Amer: 23 mL/min — ABNORMAL LOW (ref >90)
GFR calc Af Amer: 24 mL/min — ABNORMAL LOW (ref >90)
GFR calc non Af Amer: 20 mL/min — ABNORMAL LOW (ref >90)
GFR calc non Af Amer: 21 mL/min — ABNORMAL LOW (ref >90)
Glucose, Bld: 124 mg/dL — ABNORMAL HIGH (ref 70–99)
Glucose, Bld: 108 mg/dL — ABNORMAL HIGH (ref 70–99)
POTASSIUM: 2.9 mmol/L — AB (ref 3.5–5.1)
Potassium: 3.7 mmol/L (ref 3.5–5.1)
SODIUM: 149 mmol/L — AB (ref 135–145)
Sodium: 151 mmol/L — ABNORMAL HIGH (ref 135–145)

## 2014-11-29 LAB — CBC WITH DIFFERENTIAL/PLATELET
BASOS ABS: 0 10*3/uL (ref 0.0–0.1)
Basophils Relative: 0 % (ref 0–1)
EOS PCT: 0 % (ref 0–5)
Eosinophils Absolute: 0 10*3/uL (ref 0.0–0.7)
HEMATOCRIT: 34.4 % — AB (ref 39.0–52.0)
HEMOGLOBIN: 11.1 g/dL — AB (ref 13.0–17.0)
LYMPHS ABS: 0.8 10*3/uL (ref 0.7–4.0)
LYMPHS PCT: 8 % — AB (ref 12–46)
MCH: 34 pg (ref 26.0–34.0)
MCHC: 32.3 g/dL (ref 30.0–36.0)
MCV: 105.5 fL — AB (ref 78.0–100.0)
MONO ABS: 0.6 10*3/uL (ref 0.1–1.0)
Monocytes Relative: 5 % (ref 3–12)
Neutro Abs: 8.8 10*3/uL — ABNORMAL HIGH (ref 1.7–7.7)
Neutrophils Relative %: 87 % — ABNORMAL HIGH (ref 43–77)
Platelets: 163 10*3/uL (ref 150–400)
RBC: 3.26 MIL/uL — ABNORMAL LOW (ref 4.22–5.81)
RDW: 15.2 % (ref 11.5–15.5)
WBC: 10.1 10*3/uL (ref 4.0–10.5)

## 2014-11-29 LAB — URINALYSIS, ROUTINE W REFLEX MICROSCOPIC
Bilirubin Urine: NEGATIVE
Glucose, UA: NEGATIVE mg/dL
Hgb urine dipstick: NEGATIVE
Ketones, ur: NEGATIVE mg/dL
LEUKOCYTES UA: NEGATIVE
Nitrite: NEGATIVE
PH: 5 (ref 5.0–8.0)
Protein, ur: NEGATIVE mg/dL
Specific Gravity, Urine: 1.025 (ref 1.005–1.030)
Urobilinogen, UA: 0.2 mg/dL (ref 0.0–1.0)

## 2014-11-29 LAB — BRAIN NATRIURETIC PEPTIDE: B Natriuretic Peptide: 381 pg/mL — ABNORMAL HIGH (ref 0.0–100.0)

## 2014-11-29 LAB — TROPONIN I
TROPONIN I: 0.38 ng/mL — AB (ref <0.031)
TROPONIN I: 0.37 ng/mL — AB (ref <0.031)
TROPONIN I: 0.35 ng/mL — AB (ref <0.031)

## 2014-11-29 LAB — MAGNESIUM: Magnesium: 2.3 mg/dL (ref 1.5–2.5)

## 2014-11-29 MED ORDER — ACETAMINOPHEN 650 MG RE SUPP: 650.0000 mg | Freq: Four times a day (QID) | RECTAL | Status: DC | PRN

## 2014-11-29 MED ORDER — DEXTROSE 5 % IV SOLN: INTRAVENOUS | Status: DC

## 2014-11-29 MED ORDER — HEPARIN SODIUM (PORCINE) 5000 UNIT/ML IJ SOLN
5000.0000 [IU] | Freq: Three times a day (TID) | INTRAMUSCULAR | Status: DC
  Administered 2014-11-29 – 2014-12-04 (×14): 5000 [IU] via SUBCUTANEOUS
  Filled 2014-11-29 (×12): qty 1

## 2014-11-29 MED ORDER — ACETAMINOPHEN 325 MG PO TABS
650.0000 mg | ORAL_TABLET | Freq: Four times a day (QID) | ORAL | Status: DC | PRN
  Administered 2014-12-03: 650 mg via ORAL
  Filled 2014-11-29: qty 2

## 2014-11-29 MED ORDER — ASPIRIN EC 81 MG PO TBEC
81.0000 mg | DELAYED_RELEASE_TABLET | Freq: Every day | ORAL | Status: DC
  Administered 2014-11-30 – 2014-12-04 (×5): 81 mg via ORAL
  Filled 2014-11-29 (×5): qty 1

## 2014-11-29 MED ORDER — ONDANSETRON HCL 4 MG PO TABS: 4.0000 mg | ORAL_TABLET | Freq: Four times a day (QID) | ORAL | Status: DC | PRN

## 2014-11-29 MED ORDER — SIMVASTATIN 20 MG PO TABS
20.0000 mg | ORAL_TABLET | Freq: Every day | ORAL | Status: DC
  Administered 2014-11-29 – 2014-12-03 (×6): 20 mg via ORAL
  Filled 2014-11-29 (×5): qty 1

## 2014-11-29 MED ORDER — POTASSIUM CHLORIDE 10 MEQ/100ML IV SOLN
10.0000 meq | INTRAVENOUS | Status: AC
  Administered 2014-11-29 – 2014-11-30 (×6): 10 meq via INTRAVENOUS
  Filled 2014-11-29 (×4): qty 100

## 2014-11-29 MED ORDER — FERROUS SULFATE 325 (65 FE) MG PO TABS
325.0000 mg | ORAL_TABLET | Freq: Two times a day (BID) | ORAL | Status: DC
  Administered 2014-11-30 – 2014-12-04 (×9): 325 mg via ORAL
  Filled 2014-11-29 (×9): qty 1

## 2014-11-29 MED ORDER — DEXTROSE 5 % IV SOLN
INTRAVENOUS | Status: DC
  Administered 2014-11-29 – 2014-11-30 (×2): via INTRAVENOUS
  Administered 2014-11-30: 1 mL via INTRAVENOUS
  Administered 2014-12-01 – 2014-12-02 (×3): via INTRAVENOUS
  Administered 2014-12-03: 1000 mL via INTRAVENOUS

## 2014-11-29 MED ORDER — ONDANSETRON HCL 4 MG/2ML IJ SOLN: 4.0000 mg | Freq: Four times a day (QID) | INTRAMUSCULAR | Status: DC | PRN

## 2014-11-29 MED ORDER — SODIUM CHLORIDE 0.9 % IJ SOLN
3.0000 mL | Freq: Two times a day (BID) | INTRAMUSCULAR | Status: DC
  Administered 2014-11-30: 3 mL via INTRAVENOUS

## 2014-11-29 NOTE — ED Provider Notes (Signed)
CSN: [id number]     Arrival date & time 11/29/14  1345 History   First MD Initiated Contact with Patient 11/29/14 1401     Chief Complaint  Patient presents with  . Leg Swelling   Level V caveat due to dementia.  (Consider location/radiation/quality/duration/timing/severity/associated sxs/prior Treatment) The history is provided by the patient and medical records.   patient presents with increased swelling in his legs. Also has increasing creatinine and decreased potassium. Comes from [facility]. Sent in by primary care doctor. Has dementia and cannot participate in the history.  Past Medical History  Diagnosis Date  . Hypercholesteremia   . Chewing tobacco use    History reviewed. No pertinent past surgical history. History reviewed. No pertinent family history. History  Substance Use Topics  . Smoking status: Never Smoker   . Smokeless tobacco: Current User    Types: Chew  . Alcohol Use: No    Review of Systems  Unable to perform ROS     Allergies  Review of patient's allergies indicates no known allergies.  Home Medications   Prior to Admission medications   Medication Sig Start Date End Date Taking? Authorizing Provider  aspirin EC 81 MG tablet Take 81 mg by mouth daily.   Yes Historical Provider, MD  ferrous sulfate 325 (65 FE) MG tablet Take 325 mg by mouth 2 (two) times daily.   Yes Historical Provider, MD  furosemide (LASIX) 40 MG tablet Take 40-60 mg by mouth 2 (two) times daily.  in the Am and  in the Pm.   Yes Historical Provider, MD  potassium chloride SA (K-DUR,KLOR-CON) 20 MEQ tablet Take 20 mEq by mouth 2 (two) times daily. 11/08/14  Yes Historical Provider, MD  simvastatin (ZOCOR) 20 MG tablet Take 20 mg by mouth daily. 11/10/14  Yes Historical Provider, MD   BP 119/74 mmHg  Pulse 58  Temp(Src) 97.5 F (36.4 C) (Axillary)  Resp 18  Ht 6' (1.829 m)  Wt 157 lb 7 oz (71.413 kg)  BMI 21.35 kg/m2  SpO2 97% Physical Exam  Constitutional: He  appears well-developed.  HENT:  Head: Atraumatic.  Neck: Neck supple.  Cardiovascular:  Mild bradycardia  Pulmonary/Chest:  Harsh breath sounds bilaterally.  Abdominal: Soft. There is no tenderness.  Musculoskeletal: He exhibits edema.  Moderate pitting edema to bilateral lower extremities. No erythema. Multiple areas of ecchymosis worse appears on the right arm. Has had recent fall.  Skin: Skin is warm.    ED Course  Procedures (including critical care time) Labs Review Labs Reviewed  CBC WITH DIFFERENTIAL/PLATELET - Abnormal; Notable for the following:    RBC 3.26 (*)    Hemoglobin 11.1 (*)    HCT 34.4 (*)    MCV 105.5 (*)    Neutrophils Relative % 87 (*)    Neutro Abs 8.8 (*)    Lymphocytes Relative 8 (*)    All other components within normal limits  TROPONIN I - Abnormal; Notable for the following:    Troponin I 0.38 (*)    All other components within normal limits  BRAIN NATRIURETIC PEPTIDE - Abnormal; Notable for the following:    B Natriuretic Peptide 381.0 (*)    All other components within normal limits  URINE CULTURE  URINALYSIS, ROUTINE W REFLEX MICROSCOPIC  MAGNESIUM  BASIC METABOLIC PANEL  BASIC METABOLIC PANEL  BASIC METABOLIC PANEL  CBC  TROPONIN I  TROPONIN I  TROPONIN I    Imaging Review Dg Chest Portable 1 View  11/29/2014  CLINICAL DATA:  Lower extremity edema.  EXAM: PORTABLE CHEST - 1 VIEW  COMPARISON:  November 15, 2014  FINDINGS: There is cardiomegaly with right effusion. There is mild generalized interstitial edema. There is airspace consolidation in both lower lobes. There is slight pulmonary venous hypertension. No adenopathy. Bones are osteoporotic with degenerative change in each shoulder. Patient has had kyphoplasty procedures in the lower thoracic and upper lumbar spine regions.  IMPRESSION: Congestive heart failure. Cannot exclude superimposed pneumonia in the bases. Both entities may exist concurrently.   Electronically Signed   By: Bretta BangWilliam   Woodruff III M.D.   On: 11/29/2014 14:35     EKG Interpretation   Date/Time:  Wednesday November 29 2014 13:49:59 EDT Ventricular Rate:  61 PR Interval:    QRS Duration: 152 QT Interval:  547 QTC Calculation: 551 R Axis:   -44 Text Interpretation:  Atrial fibrillation Ventricular premature complex  RBBB and LAFB No significant change since last tracing Confirmed by  Hawken Bielby  MD, Harrold DonathNATHAN [phone]([id number]) on 11/29/2014 2:22:53 PM      MDM   Final diagnoses:  Congestive heart failure, unspecified congestive heart failure chronicity, unspecified congestive heart failure type  Renal insufficiency   Patient presents with swelling in his legs. Unable to give history. Sent from nursing home for increasing creatinine and hypokalemia. Patient is a DO NOT RESUSCITATE. Troponin is mildly elevated but patient is unable to tell me if he has chest pain. Has had recent falls and has multiple areas of bruising. Hesitate to anticoagulate at this time. Will admit to internal medicine.     Benjiman CoreNathan Koran Seabrook, MD 11/29/14 1740

## 2014-11-29 NOTE — H&P (Addendum)
[facility] History and Physical  [name] ZOX:[id number] DOB: 1912-02-28 DOA: 11/29/2014  Referring physician: Dr. Rubin Payor - [facility] PCP: Alice Reichert, MD   Chief Complaint: Leg swelling  HPI: [name] is a 81 y.o. male  Level V caveat: Patient severely demented and unable to participate in history. History of present illness given by nursing home staff, family members, and ED physician Dr. Rubin Payor.   Patient presenting from [facility] nursing home due to bilateral lower extremity edema and lab abnormality. Patient noted to have a low potassium and high sodium per nursing home staff. Of note [facility]18/2013. Patient was experiencing dysphagia with his diet time of discharge and was changed to a pure thick diet at the nursing home. There is difficulty ascertaining patient's true free water intake during this period of time as patient does not eat without assistance. Patient's Lasix was increased to 60 mg in the morning and 40 mg in the evening without improvement in his lower extremity swelling. Patient is without any complaint at this time and his dementia appears to be at baseline.   Review of Systems:   patient and severely demented and unable to participate. No further review of systems outside of what is mentioned in the history of present illness as was given by nursing home staff and family members.    Past Medical History  Diagnosis Date  . Hypercholesteremia   . Chewing tobacco use    History reviewed. No pertinent past surgical history. Social History:  reports that he has never smoked. His smokeless tobacco use includes Chew. He reports that he does not drink alcohol or use illicit drugs.  No Known Allergies  History reviewed. No pertinent family history.   Prior to Admission medications   Medication Sig Start Date End Date Taking? Authorizing Provider  aspirin EC 81 MG tablet Take 81 mg by mouth daily.   Yes  Historical Provider, MD  ferrous sulfate 325 (65 FE) MG tablet Take 325 mg by mouth 2 (two) times daily.   Yes Historical Provider, MD  furosemide (LASIX) 40 MG tablet Take 40-60 mg by mouth 2 (two) times daily.  in the Am and  in the Pm.   Yes Historical Provider, MD  potassium chloride SA (K-DUR,KLOR-CON) 20 MEQ tablet Take 20 mEq by mouth 2 (two) times daily. 11/08/14  Yes Historical Provider, MD  simvastatin (ZOCOR) 20 MG tablet Take 20 mg by mouth daily. 11/10/14  Yes Historical Provider, MD   Physical Exam: Filed Vitals:   11/29/14 1415 11/29/14 1500 11/29/14 1600 11/29/14 1657  BP:  103/62 106/59 119/74  Pulse:    58  Temp:    97.5 F (36.4 C)  TempSrc:    Axillary  Resp:  Height:    6' (1.829 m)  Weight:    71.413 kg (157 lb 7 oz)  SpO2: 97%   97%    Wt Readings from Last 3 Encounters:  11/29/14 71.413 kg (157 lb 7 oz)  11/19/14 77 kg (169 lb 12.1 oz)    General:  Appears calm and comfortable Eyes:  PERRL, normal lids, irises & conjunctiva ENT: Dry mm Neck:  no LAD, masses or thyromegaly Cardiovascular: faint heart sounds, 3/6 systolic murmur, irregularly irregular, 3+ pitting edema bilaterally from the knees to the tips of the feet.  Telemetry:    A. fib  Respiratory: minimal crackles bilaterally in the bases. Normal effort. No rhonchi or wheezes. On  room air.  Abdomen:  soft, ntnd Skin: diffuse ecchymoses throughout upper extremities and trunk and to a lesser degree lower extremity is.  Musculoskeletal:   Globally weak but symmetric tone in upper and lower extremities bilaterally  Psychiatric:  patient severely demented and unable to give any coherent response. Does follow some commands such as "move your toes. " Neurologic:  Cranial nerves grossly intact. Moves all extremities in coordinated fashion..          Labs on Admission:  Basic Metabolic Panel:  Recent Labs Lab 11/29/14 1100  NA 151*  K 2.9*  CL 117*  CO2 26  GLUCOSE 124*  BUN 47*    CREATININE 2.48*  CALCIUM 8.1*   Liver Function Tests: No results for input(s): AST, ALT, ALKPHOS, BILITOT, PROT, ALBUMIN in the last 168 hours. No results for input(s): LIPASE, AMYLASE in the last 168 hours. No results for input(s): AMMONIA in the last 168 hours. CBC:  Recent Labs Lab 11/29/14 1405  WBC 10.1  NEUTROABS 8.8*  HGB 11.1*  HCT 34.4*  MCV 105.5*  PLT 163   Cardiac Enzymes:  Recent Labs Lab 11/29/14 1405  TROPONINI 0.38*    BNP (last 3 results)  Recent Labs  11/29/14 1405  BNP 381.0*    ProBNP (last 3 results) No results for input(s): PROBNP in the last 8760 hours.  CBG: No results for input(s): GLUCAP in the last 168 hours.  Radiological Exams on Admission: Dg Chest Portable 1 View  11/29/2014   CLINICAL DATA:  Lower extremity edema.  EXAM: PORTABLE CHEST - 1 VIEW  COMPARISON:  November 15, 2014  FINDINGS: There is cardiomegaly with right effusion. There is mild generalized interstitial edema. There is airspace consolidation in both lower lobes. There is slight pulmonary venous hypertension. No adenopathy. Bones are osteoporotic with degenerative change in each shoulder. Patient has had kyphoplasty procedures in the lower thoracic and upper lumbar spine regions.  IMPRESSION: Congestive heart failure. Cannot exclude superimposed pneumonia in the bases. Both entities may exist concurrently.   Electronically Signed   By: Bretta BangWilliam  Woodruff III M.D.   On: 11/29/2014 14:35    EKG: Independently reviewed. Afib. RBBB. LAFB, brady. No sign of ACS, no significant change from previous  Assessment/Plan Principal Problem:   Electrolyte imbalance Active Problems:   Rib fracture   CHF (congestive heart failure)   Elevated troponin   Protein-calorie malnutrition, severe   Physical deconditioning   Chronic a-fib   Anemia   HLD (hyperlipidemia)   Social: Lengthy discussion had with patient's family regarding multiple serious medical problems and possibility of  slow progressive organ failure. Family very much aware of severity of illnesses in requesting that patient be made comfortable and no heroic measures made at this time. Family aware that patient's conditions may continue to decline without resolution prior to end-of-life. - Palliative care consult for goals of care.  Metabolic derangements: Hypernatremia 151. Likely secondary to dehydration due to very little free water intake since changing diet, and aggressive diuresis.. Potassium 2.9 likely secondary to significant increase in diuresis at nursing home.  EKG without marked changes. Careful balance will need to be maintained between metabolic derangements, acute on chronic kidney disease, heart failure, and heart strain causing troponin elevation. - Telemetry  - D5 and free water at 1 mg/kg/h with goal correction of sodium of 1-2 mEq per hour.  - Magnesium level  - KCl 10 mEq 6  - Staff to assist with eating  - Nutrition consult  Leg swelling: Likely secondary to congestive heart failure. Last echo from 2010 showing EF of 60% without mention of diastolic dysfunction.  BNP 381.Albumin 2.3 and total protein 6.0 at baseline for patient so unlikely contributing. Little to no improvement with increase Lasix at nursing home. - Echo  - Consider diuresis with torsemide after correction of severe intravascular depletion depletion, hypernatremia.  - Strict I's and O's - Daily weights  Elevated Troponin: 0.38 on admission. Likely secondary to right heart strain from CHF, and decreased renal function given creatinine of 2.48. Discussed at length with cardiology who agrees with watching pt he not a cardiac cath candidate. No need for formal consult. Anticipate this to worsen as CHF worsens. If becomes markedly elevated will attempt diuresis. No need for Heparin at this point in time.  - Telemetry - Cycle troponins  AoCKD III: Cr 2.48, baseline 1.8. Likely from dehydration and diuresis. Anticipate some  improvement with hydration but will likely worsen once starting diuresis.  Severe protein calorie malnutrition: Patient with poor nutrition which was worsened due to patient's dysphagia. - Nutrition consult - PUree diet - Swallow eval  Physical deconditioning: Patient very elderly and continues to die aggressive with regards to physical abilities. This issue was compounded by recent rib fractures. - PT/OT   A. fib: Chronic condition for patient. Not an anticoagulation candidate. Patient bradycardic.  Elevated Troponin: 0.38 on admission - continue ASA   UTI: Patient with previous admission for UTI. Completed course of Avelox.  - Repeat UA and urine culture   HLD: - continue statin  Anemia: Hgb 11. Baseline 10.5. Macrocytic w/ MCV 105.  - CBC in am - continue iron  Code Status: DNR DVT Prophylaxis: Hep Family Communication: Son and daughter in law Disposition Plan: pending improvement or goals of care decisions.    Karigan Cloninger, Elmon Else, MD Family Medicine [facility] [email or web address] Password [email or web address]

## 2014-11-29 NOTE — ED Notes (Addendum)
Pt sent from [facility] do to increase leg swelling and pt already taking 100mg  lasix. BMP done at center and abnormal labs resulted. Pt sent here for further evaluation.

## 2014-11-29 NOTE — Progress Notes (Signed)
Attempted to perform swallow screen, pt history of difficulty swallowing prompted for Speech evaluation.  Pt becomes choked and gargles with small swallows.  Will continue to monitor.

## 2014-11-30 DIAGNOSIS — I509 Heart failure, unspecified: Secondary | ICD-10-CM

## 2014-11-30 LAB — VITAMIN D 1,25 DIHYDROXY

## 2014-11-30 LAB — URINE CULTURE
Colony Count: NO GROWTH
Culture: NO GROWTH

## 2014-11-30 LAB — BASIC METABOLIC PANEL
Anion gap: 5 (ref 5–15)
Anion gap: 6 (ref 5–15)
BUN: 47 mg/dL — ABNORMAL HIGH (ref 6–23)
BUN: 46 mg/dL — AB (ref 6–23)
CO2: 25 mmol/L (ref 19–32)
CO2: 25 mmol/L (ref 19–32)
Calcium: 7.8 mg/dL — ABNORMAL LOW (ref 8.4–10.5)
Calcium: 8 mg/dL — ABNORMAL LOW (ref 8.4–10.5)
Chloride: 116 mmol/L — ABNORMAL HIGH (ref 96–112)
Chloride: 117 mmol/L — ABNORMAL HIGH (ref 96–112)
Creatinine, Ser: 2.17 mg/dL — ABNORMAL HIGH (ref 0.50–1.35)
Creatinine, Ser: 2.23 mg/dL — ABNORMAL HIGH (ref 0.50–1.35)
GFR calc Af Amer: 27 mL/min — ABNORMAL LOW (ref >90)
GFR calc Af Amer: 26 mL/min — ABNORMAL LOW (ref >90)
GFR calc non Af Amer: 23 mL/min — ABNORMAL LOW (ref >90)
GFR, EST NON AFRICAN AMERICAN: 23 mL/min — AB (ref >90)
Glucose, Bld: 108 mg/dL — ABNORMAL HIGH (ref 70–99)
Glucose, Bld: 97 mg/dL (ref 70–99)
POTASSIUM: 3.7 mmol/L (ref 3.5–5.1)
Potassium: 3.9 mmol/L (ref 3.5–5.1)
SODIUM: 148 mmol/L — AB (ref 135–145)
Sodium: 146 mmol/L — ABNORMAL HIGH (ref 135–145)

## 2014-11-30 LAB — CBC
HEMATOCRIT: 32.3 % — AB (ref 39.0–52.0)
Hemoglobin: 10.6 g/dL — ABNORMAL LOW (ref 13.0–17.0)
MCH: 34.3 pg — AB (ref 26.0–34.0)
MCHC: 32.8 g/dL (ref 30.0–36.0)
MCV: 104.5 fL — ABNORMAL HIGH (ref 78.0–100.0)
PLATELETS: 182 10*3/uL (ref 150–400)
RBC: 3.09 MIL/uL — AB (ref 4.22–5.81)
RDW: 15.4 % (ref 11.5–15.5)
WBC: 7.1 10*3/uL (ref 4.0–10.5)

## 2014-11-30 LAB — TROPONIN I: Troponin I: 0.32 ng/mL — ABNORMAL HIGH (ref <0.031)

## 2014-11-30 MED ORDER — RESOURCE THICKENUP CLEAR PO POWD
ORAL | Status: DC | PRN
  Administered 2014-12-01: 13:00:00 via ORAL
  Filled 2014-11-30: qty 125

## 2014-11-30 MED ORDER — TORSEMIDE 20 MG PO TABS
10.0000 mg | ORAL_TABLET | Freq: Every day | ORAL | Status: DC
  Administered 2014-11-30 – 2014-12-01 (×2): 10 mg via ORAL
  Filled 2014-11-30 (×2): qty 1

## 2014-11-30 MED ORDER — VANCOMYCIN HCL IN DEXTROSE 750-5 MG/150ML-% IV SOLN
750.0000 mg | INTRAVENOUS | Status: DC
  Administered 2014-12-01 – 2014-12-02 (×2): 750 mg via INTRAVENOUS
  Filled 2014-11-30 (×4): qty 150

## 2014-11-30 MED ORDER — VANCOMYCIN HCL IN DEXTROSE 1-5 GM/200ML-% IV SOLN
1000.0000 mg | Freq: Once | INTRAVENOUS | Status: AC
  Administered 2014-11-30: 1000 mg via INTRAVENOUS
  Filled 2014-11-30 (×2): qty 200

## 2014-11-30 NOTE — Clinical Social Work Psychosocial (Signed)
Clinical Social Work Department BRIEF PSYCHOSOCIAL ASSESSMENT 11/30/2014  Patient:  [name],[name]     Account Number:  [id number]     Admit date:  11/29/2014  Clinical Social Worker:  Nancie NeasSTULTZ,Kemia Wendel, LCSW  Date/Time:  11/30/2014 04:03 PM  Referred by:  CSW  Date Referred:  11/30/2014 Referred for  SNF Placement   Other Referral:   Interview type:  Family Other interview type:   son- [place]Wayne    PSYCHOSOCIAL DATA Living Status:  FACILITY Ad[facility] from [facility] Level of care:  Skilled Nursing Facility Primary support name:  [name] Primary support relationship to patient:  CHILD, ADULT Degree of support available:   supportive    CURRENT CONCERNS Current Concerns  Post-Acute Placement   Other Concerns:    SOCIAL WORK ASSESSMENT / PLAN CSW spoke with [place] on phone as pt sleeping at time of CSW visit. Pt known to CSW from admission several weeks ago. [facility] [name] indicates that pt had been working with therapy, but had "good days and bad days." Family is very involved and supportive and visits often. [name] shared that he discussed possibility of palliative care for pt with EDP last night. However, he wishes to continue treatment to a "reasonable extent" for now. He feels pt has lived a long, good life and does not want any extreme measures taken. CSW provided support. [facility], okay for return and no FL2 needed.   Assessment/plan status:  Psychosocial Support/Ongoing Assessment of Needs Other assessment/ plan:   Information/referral to community resources:   [facility]    PATIENT'S/FAMILY'S RESPONSE TO PLAN OF CARE: Pt unable to participate in plan of care at this time. Pt's [facility] that plan would be for return there when medically stable. CSW will continue to follow.       Derenda FennelKara Ronnie Mallette, KentuckyLCSW [phone]

## 2014-11-30 NOTE — Progress Notes (Signed)
INITIAL NUTRITION ASSESSMENT  DOCUMENTATION CODES Per approved criteria  -Severe malnutrition in the context of acute illness or injury   INTERVENTION:Follow to add appropriate supplements based on results for ST evaluate  NUTRITION DIAGNOSIS: Inadequate oral intake related to swallow difficulty as evidenced by pt hx and meal intake records  Goal: Pt to meet >/= 90% of their estimated nutrition needs    Monitor:  Diet advancement and tolerance, percent meal intake, labs and wt trends   Reason for Assessment: Malnutrition Screen   [age] y.o. male  Admitting Dx: Electrolyte imbalance  ASSESSMENT: [facility]. He has 5% wt loss x 14 days and poor po intake past 10 days. Today he ate 1/2 of oatmeal for breakfast, bites of applesauce with meds and refused lunch. Speech therapy consult pending. No aggressive interventions being persured per family wishes. Nutrition exam: moderate wasting at temporal and clavicle region and moderate fat loss of fat mass upper arms. Moderate edema to lower extremities.   Pt meets criteria for severe MALNUTRITION in the context of acute illness as evidenced by 5% wt loss 14 days, </= 50% energy intake for >/= 5 days and moderate muscle wasting, moderate fluid accumulation.   Height: Ht Readings from Last 1 Encounters:  11/29/14 6' (1.829 m)    Weight: Wt Readings from Last 1 Encounters:  11/30/14 160 lb (72.576 kg)    Ideal Body Weight: 178# (80.9 kg)  % Ideal Body Weight: 90%  Wt Readings from Last 10 Encounters:  11/30/14 160 lb (72.576 kg)  11/19/14 169 lb 12.1 oz (77 kg)    Usual Body Weight: 169#  % Usual Body Weight: 95%  BMI:  Body mass index is 21.7 kg/(m^2). normal range  Estimated Nutritional Needs: Kcal: 1800-2000 Protein: 90-95 gr Fluid: 1.8-2.0 (normal needs)  Skin: no issues   Diet Order: DIET - DYS 1 Room service appropriate?: Yes; Fluid consistency:: Thin  EDUCATION NEEDS: -No education needs  identified at this time   Intake/Output Summary (Last 24 hours) at 11/30/14 0849 Last data filed at 11/29/14 1826  Gross per 24 hour  Intake  127.5 ml  Output    250 ml  Net -122.5 ml    Last BM: prior to admission  Labs:   Recent Labs Lab 11/29/14 1732 11/29/14 2202 11/30/14 0052 11/30/14 0439  NA  --  149* 146* 148*  K  --  3.7 3.9 3.7  CL  --  118* 116* 117*  CO2  --  24 25 25   BUN  --  46* 47* 46*  CREATININE  --  2.38* 2.17* 2.23*  CALCIUM  --  7.9* 7.8* 8.0*  MG 2.3  --   --   --   GLUCOSE  --  108* 108* 97    CBG (last 3)  No results for input(s): GLUCAP in the last 72 hours.  Scheduled Meds: . aspirin EC  81 mg Oral Daily  . ferrous sulfate  325 mg Oral BID  . heparin  5,000 Units Subcutaneous 3 times per day  . simvastatin  20 mg Oral q1800  . sodium chloride  3 mL Intravenous Q12H  . torsemide  10 mg Oral Daily  . vancomycin  1,000 mg Intravenous Once    Continuous Infusions: . dextrose 75 mL/hr at 11/30/14 [id number]    Past Medical History  Diagnosis Date  . Hypercholesteremia   . Chewing tobacco use     History reviewed. No pertinent past surgical history.  Larita FifeLynn  Nickola Lenig MS,RD,CSG,LDN Office: [phone] Pager: [phone]

## 2014-11-30 NOTE — Evaluation (Signed)
Clinical/Bedside Swallow Evaluation Patient Details  Name: [name] MRN: [id number] Date of Birth: 1911/11/17  Today's Date: 11/30/2014 Time: SLP Start Time (ACUTE ONLY): 1800 SLP Stop Time (ACUTE ONLY): 1826 SLP Time Calculation (min) (ACUTE ONLY): 26 min  Past Medical History:  Past Medical History  Diagnosis Date  . Hypercholesteremia   . Chewing tobacco use    Past Surgical History: History reviewed. No pertinent past surgical history. HPI:  [name] is a 81 yo male who is known to this SLP from recent admission (March 2016). He was placed on NTL during that admission and then admitted to [facility] for rehab. He was downgraded to puree diet with honey-thick liquids. Patient presenting from [facility] SNF due to bilateral lower extremity edema and lab abnormality. Patient noted to have a low potassium and high sodium per nursing home staff. [facility]. Prior to his recent hospitalization in March, pt was living alone with assist from family nearby. When pt was transferred from [facility] to [facility], his diet was not transferred and he was given thin liquids. RN noted difficulty and SLP eval ordered and diet changed to puree with HTL.   Assessment / Plan / Recommendation Clinical Impression  Mr. Hane appears less alert and less talkative than when seen during previous hospitalization just a couple of weeks ago. Daughter in law was in room and had just finished feeding him dinner meal where he ate ~50% of his puree tray and honey-thick liquids. Mr. Isola has audible congestion and seemingly decreased hyolaryngeal excursion during po intake. He appears weak and swallows several times to clear single bites puree. Family has indicated that pt would not want a feeding tube. Pt desires water, but tolerates the honey-thickened water without too much complaint. Mr. Fare is at moderate risk for aspiration given advanced age, decreased  ability to generate strong cough due to rib fractures, and current weakness. Risks can be minimized but not eliminated by providing modified diet of puree and honey-thick liquids and implementing aspiration precautions. Daughter in law present during my visit and acknowledges pt's appreciable risk for aspiration and feels like he may be "shutting down". Recommend continuing puree diet with honey-thick liquids and consider allowing free water if pt moves toward hospice. SLP will follow per goals of care.    Aspiration Risk  Moderate    Diet Recommendation Dysphagia 1 (Puree);Honey-thick liquid   Liquid Administration via: Cup Medication Administration: Crushed with puree Supervision: Staff to assist with self feeding;Full supervision/cueing for compensatory strategies Compensations: Slow rate;Small sips/bites;Multiple dry swallows after each bite/sip;Effortful swallow Postural Changes and/or Swallow Maneuvers: Seated upright 90 degrees;Upright 30-60 min after meal    Other  Recommendations Oral Care Recommendations: Oral care BID;Staff/trained caregiver to provide oral care Other Recommendations: Order thickener from pharmacy;Prohibited food (jello, ice cream, thin soups);Clarify dietary restrictions   Follow Up Recommendations  Skilled Nursing facility    Frequency and Duration min 1 x/week  1 week   Pertinent Vitals/Pain VSS    SLP Swallow Goals   Pt will demonstrate safe and efficient consumption of least restrictive diet with use of strategies as needed.    Swallow Study Prior Functional Status   Previously lived alone with family nearby, now at [facility]    General Date of Onset: 11/15/14 HPI: [name] is a 81 yo male who is known to this SLP from recent admission (March 2016). He was placed on NTL during that admission and then  [facility]. He was downgraded to puree diet with honey-thick liquids. [facility] due to bilateral  lower extremity edema and lab abnormality. Patient noted to have a low potassium and high sodium per nursing home staff. Of [facility]. Prior to his recent hospitalization in March, pt was living alone with assist from family nearby. [facility], his diet was not transferred and he was given thin liquids. RN noted difficulty and SLP eval ordered and diet changed to puree with HTL. Type of Study: Bedside swallow evaluation Previous Swallow Assessment: 11/16/2014 BSE Diet Prior to this Study: Dysphagia 1 (puree);Honey-thick liquids Temperature Spikes Noted: No Respiratory Status: Room air History of Recent Intubation: No Behavior/Cognition: Cooperative;Decreased sustained attention;Requires cueing;Hard of hearing Oral Cavity - Dentition: Poor condition Self-Feeding Abilities: Needs assist Patient Positioning: Upright in bed Baseline Vocal Quality: Clear (but audible congestion) Volitional Cough: Weak;Congested Volitional Swallow: Able to elicit    Oral/Motor/Sensory Function Overall Oral Motor/Sensory Function: Appears within functional limits for tasks assessed Lingual Strength: Reduced   Ice Chips Ice chips: Not tested   Thin Liquid Thin Liquid: Not tested    Nectar Thick Nectar Thick Liquid: Not tested   Honey Thick Honey Thick Liquid: Impaired Presentation: Cup Pharyngeal Phase Impairments: Suspected delayed Swallow;Decreased hyoid-laryngeal movement;Wet Vocal Quality   Puree Puree: Impaired Presentation: Spoon Oral Phase Functional Implications: Prolonged oral transit Pharyngeal Phase Impairments: Suspected delayed Swallow;Decreased hyoid-laryngeal movement   Solid  Thank you,  Havery MorosDabney Porter, CCC-SLP [phone]     Solid: Not tested       PORTER,DABNEY 11/30/2014,8:20 PM

## 2014-11-30 NOTE — Progress Notes (Signed)
Subjective: The patient is fairly alert this morning. He received D5 and free water and potassium supplements. His current sodium is 148 potassium 3.7. The patient has prior ejection fraction of 60%. He does have evidence of cardiomegaly with right pleural effusion and peripheral edema  Objective: Vital signs in last 24 hours: Temp:  [97.5 F (36.4 C)-98.6 F (37 C)] 97.9 F (36.6 C) (04/07 0429) Pulse Rate:  [25-58] 53 (04/07 0429) Resp:  [16-21] 18 (04/07 0429) BP: (103-120)/(55-74) 120/55 mmHg (04/07 0429) SpO2:  [92 %-100 %] 100 % (04/07 0429) Weight:  [71.413 kg (157 lb 7 oz)-72.576 kg (160 lb)] 72.576 kg (160 lb) (04/07 0429) Weight change:  Last BM Date:  (UTA, pt unsure)  Intake/Output from previous day: 04/06 0701 - 04/07 0700 In: 127.5 [I.V.:27.5; IV Piggyback:100] Out: 250 [Urine:250] Intake/Output this shift:    Physical Exam: General appearance patient is more alert and comfortable  HEENT negative  Neck supple no JVD or thyroid abnormalities  Heart irregular rhythm no obvious cardiomegaly  Lungs occasional rhonchus heard over lower lung field  Abdomen no palpable organs or masses  Extremities 2-3+ edema  Skin diffuse ecchymoses over her upper extremities and trunk   Recent Labs  11/29/14 1405 11/30/14 0439  WBC 10.1 7.1  HGB 11.1* 10.6*  HCT 34.4* 32.3*  PLT 163 182   BMET  Recent Labs  11/30/14 0052 11/30/14 0439  NA 146* 148*  K 3.9 3.7  CL 116* 117*  CO2 25 25  GLUCOSE 108* 97  BUN 47* 46*  CREATININE 2.17* 2.23*  CALCIUM 7.8* 8.0*    Studies/Results: Dg Chest Portable 1 View  11/29/2014   CLINICAL DATA:  Lower extremity edema.  EXAM: PORTABLE CHEST - 1 VIEW  COMPARISON:  November 15, 2014  FINDINGS: There is cardiomegaly with right effusion. There is mild generalized interstitial edema. There is airspace consolidation in both lower lobes. There is slight pulmonary venous hypertension. No adenopathy. Bones are osteoporotic with  degenerative change in each shoulder. Patient has had kyphoplasty procedures in the lower thoracic and upper lumbar spine regions.  IMPRESSION: Congestive heart failure. Cannot exclude superimposed pneumonia in the bases. Both entities may exist concurrently.   Electronically Signed   By: Bretta BangWilliam  Woodruff III M.D.   On: 11/29/2014 14:35    Medications:  . aspirin EC  81 mg Oral Daily  . ferrous sulfate  325 mg Oral BID  . heparin  5,000 Units Subcutaneous 3 times per day  . simvastatin  20 mg Oral q1800  . sodium chloride  3 mL Intravenous Q12H    . dextrose 75 mL/hr at 11/29/14 1804     Assessment/Plan: 1. Congestive heart failure probable diastolic with elevated pro BNP-prior ejection fraction 60%-plan to reinstitute torsemide and closely monitor electrolytes-patient had low potassium and elevated sodium-continue to monitor  2. Electrolyte imbalance-continue to monitor chemistries  3. Prior rib fractures.  4. Chronic atrial fibrillation-no plan to anticoagulate patient   LOS: 1 day   Lise Pincus G 11/30/2014, 6:20 AM

## 2014-11-30 NOTE — Progress Notes (Signed)
UR chart review completed.  

## 2014-11-30 NOTE — Progress Notes (Signed)
Physical Therapy Evaluation Patient Details Name: [name] MRN: [id number] DOB: 1911/12/11 Today's Date: 11/30/2014   History of Present Illness  Patient presenting from [facility] nursing home due to bilateral lower extremity edema and lab abnormality. Patient noted to have a low potassium and high sodium per nursing home staff. Of [facility]. Patient was experiencing dysphagia with his diet time of discharge and was changed to a pure thick diet at the nursing home  Clinical Impression  [name] is a 81 yo male who recently went to the [facility] for rehab to improve his functioning status.  He has been admitted to [facility] and will need continued rehab while he is here as well as when he returns to the SNF.  At this time he needs total assist with bed mobility.  Transfers were not able to be assessed secondary to pt refusing.  [name] wil benefit from skilled therapy to increase his overall strength as well as to improve his I in transferring.     Follow Up Recommendations SNF    Equipment Recommendations  None recommended by PT    Recommendations for Other Services   OT    Precautions / Restrictions Precautions Precautions: Fall Precaution Comments: right fx ribs Restrictions Weight Bearing Restrictions: No      Mobility  Bed Mobility Overal bed mobility: Needs Assistance Bed Mobility: Supine to Sit;Sit to Supine     Supine to sit: Total assist Sit to supine: Total assist      Transfers                 General transfer comment: Pt refused to get into a chair at this time.                Pertinent Vitals/Pain  none verbalized     Home Living Family/patient expects to be discharged to:: Skilled nursing facility                 Additional Comments: Pt admitted from SNF    Prior Function Level of Independence: Needs assistance   Gait / Transfers Assistance Needed: pt is not  ambulatory, mobilizes with a w/c...he apparently was able to  transfers bed to w/c independently up until two weeks ago   ADL's / Homemaking Assistance Needed: it is assumed that pt needs full assist with all household activities and personal grooming        Hand Dominance   right  Extremity/Trunk Assessment               Lower Extremity Assessment: Generalized weakness         Communication   Communication: Expressive difficulties;HOH  Cognition Arousal/Alertness: Awake/alert   Overall Cognitive Status: History of cognitive impairments - at baseline                         Exercises General Exercises - Lower Extremity Ankle Circles/Pumps: Both;10 reps;AROM Quad Sets: Both;10 reps;AROM Gluteal Sets: 10 reps;AROM Long Arc Quad: AAROM;Both;10 reps Hip ABduction/ADduction: AAROM;Both;10 reps      Assessment/Plan    PT Assessment Patient needs continued PT services  PT Diagnosis Generalized weakness   PT Problem List Decreased strength;Decreased activity tolerance;Decreased mobility  PT Treatment Interventions Functional mobility training;Therapeutic exercise   PT Goals (Current goals can be found in the Care Plan section) Acute Rehab PT Goals Patient Stated Goal: none stated PT Goal Formulation: Patient unable to participate  in goal setting Time For Goal Achievement: 05/11/15 Potential to Achieve Goals: Fair    Frequency Min 3X/week        Co-evaluation     PT goals addressed during session: Mobility/safety with mobility         End of Session   Activity Tolerance: Patient limited by fatigue Patient left: in bed;with call bell/phone within reach;with bed alarm set Nurse Communication: Mobility status         Time: [id number] PT Time Calculation (min) (ACUTE ONLY): 33 min   Charges:   PT Evaluation $Initial PT Evaluation Tier I: 1 Procedure         Virgina OrganCynthia Honor Fairbank, PT CLT [phone] 11/30/2014, 11:36 AM

## 2014-11-30 NOTE — Progress Notes (Signed)
OT Screen  Patient Details Name: [name] MRN: [id number] DOB: 02/07/1912   OT Screen:     Reason evaluation not completed: Pt screened, grandson in room & provided necessary information. [facility], unsure if PT, OT, or both. Pt with baseline history of dementia, is at baseline with all B/IADLs, no further acute OT needs.   Ezra SitesLeslie Tammey Deeg, OTR/L  [phone] 11/30/2014, 10:01 AM

## 2014-11-30 NOTE — Care Management Note (Addendum)
    Page 1 of 1   01-17-2015     7:39:49 AM CARE MANAGEMENT NOTE 01-17-2015  Patient:  Floy SabinaLLINGTON,Maan J   Account Number:  [id number]  Date Initiated:  11/30/2014  Documentation initiated by:  Sharrie RothmanBLACKWELL,Javione Gunawan C  Subjective/Objective Assessment:   [facility]. Anticipate discharge back to facility.     Action/Plan:   CSW is aware and will arrange discharge back to facility when medically stable.   Anticipated DC Date:  005-25-2016   Anticipated DC Plan:  SKILLED NURSING FACILITY  In-house referral  Clinical Social Worker      DC Planning Services  CM consult      Choice offered to / List presented to:             Status of service:  Completed, signed off Medicare Important Message given?  YES (If response is "NO", the following Medicare IM given date fields will be blank) Date Medicare IM given:  12/01/2014 Medicare IM given by:  Arlyss QueenBLACKWELL,Chinara Hertzberg C Date Additional Medicare IM given:  005-25-2016 Additional Medicare IM given by:  Sharrie RothmanAMMY C Tyshun Tuckerman  Discharge Disposition:  SKILLED NURSING FACILITY  Per UR Regulation:    If discussed at Long Length of Stay Meetings, dates discussed:    Comments:  07-12-2015 0735 Arlyss Queenammy Etheridge Geil, RN BSN CM [facility]. CSW to arrange discharge to facility.  12/01/14 1150 Arlyss Queenammy Beauden Tremont, RN BSN CM Pt may discharge over the weekend. MD is aware that pt [facility] over the [facility].  11/30/14 1050 Arlyss Queenammy Raeven Pint, RN BSN CM

## 2014-11-30 NOTE — Progress Notes (Signed)
Discussed with Dr Renard MatterMcInnis.  Asked to order Demadex 10mg  daily while inpatient.  Pt was on Lasix PTA.  IV Demadex is unavailable.  Demadex 10mg  po daily ordered.   Monitor electrolytes and SCr.   Scharlene GlossS. Aleksandr Pellow, PharmD

## 2014-11-30 NOTE — Progress Notes (Signed)
ANTIBIOTIC CONSULT NOTE-Preliminary  Pharmacy Consult for Vancomycin Indication: Pneumonia  No Known Allergies  Patient Measurements: Height: 6' (182.9 cm) Weight: 160 lb (72.576 kg) IBW/kg (Calculated) : 77.6  Vital Signs: Temp: 97.9 F (36.6 C) (04/07 0429) Temp Source: Oral (04/07 0429) BP: 120/55 mmHg (04/07 0429) Pulse Rate: 53 (04/07 0429)  Labs:  Recent Labs  11/29/14 1405 11/29/14 2202 11/30/14 0052 11/30/14 0439  WBC 10.1  --   --  7.1  HGB 11.1*  --   --  10.6*  PLT 163  --   --  182  CREATININE  --  2.38* 2.17* 2.23*    Estimated Creatinine Clearance: 18.1 mL/min (by C-G formula based on Cr of 2.23).  No results for input(s): VANCOTROUGH, VANCOPEAK, VANCORANDOM, GENTTROUGH, GENTPEAK, GENTRANDOM, TOBRATROUGH, TOBRAPEAK, TOBRARND, AMIKACINPEAK, AMIKACINTROU, AMIKACIN in the last 72 hours.   Microbiology: Recent Results (from the past 720 hour(s))  Urine culture     Status: None   Collection Time: 11/15/14  9:30 PM  Result Value Ref Range Status   Specimen Description URINE, CLEAN CATCH  Final   Special Requests NONE  Final   Colony Count   Final    >=100,000 COLONIES/ML Performed at [facility]    Culture   Final    [facility] Performed at [facility]    Report Status 11/18/2014 FINAL  Final   Organism ID, Bacteria MORGANELLA MORGANII  Final      Susceptibility   Morganella morganii - MIC*    AMPICILLIN >=32 RESISTANT Resistant     CEFAZOLIN >=64 RESISTANT Resistant     CEFTRIAXONE <=1 SENSITIVE Sensitive     CIPROFLOXACIN <=0.25 SENSITIVE Sensitive     GENTAMICIN <=1 SENSITIVE Sensitive     LEVOFLOXACIN <=0.12 SENSITIVE Sensitive     NITROFURANTOIN 128 RESISTANT Resistant     TOBRAMYCIN <=1 SENSITIVE Sensitive     TRIMETH/SULFA <=20 SENSITIVE Sensitive     PIP/TAZO <=4 SENSITIVE Sensitive     * MORGANELLA MORGANII    Medical History: Past Medical History  Diagnosis Date  . Hypercholesteremia   . Chewing  tobacco use     Medications:   Assessment: [age] yo male [facility] admitted on [facility]/06 for leg swelling with metabolic abnormalities d/t dehydration. Pt recently discharged from [facility] s/p fall and treatment for UTI. Empiric antibiotics for pneumonia. Possible aspiration with swallow study pending.  Goal of Therapy:  Vancomycin troughs 15-20 mcg/ml  Plan:  Preliminary review of pertinent patient information completed.  Protocol will be initiated with a one-time dose of Vancomycin 1 Gm IV.  [facility]ani [facility] clinical pharmacist will complete review during morning rounds to assess patient and finalize treatment regimen.  Arelia SneddonMason, Zelphia Glover Anne, [facility] CenterRPH 11/30/2014,7:05 AM

## 2014-11-30 NOTE — Progress Notes (Signed)
ANTIBIOTIC CONSULT NOTE-follow up  Pharmacy Consult for Vancomycin Indication: Pneumonia  No Known Allergies  Patient Measurements: Height: 6' (182.9 cm) Weight: 160 lb (72.576 kg) IBW/kg (Calculated) : 77.6  Vital Signs: Temp: 97.9 F (36.6 C) (04/07 0429) Temp Source: Oral (04/07 0429) BP: 120/55 mmHg (04/07 0429) Pulse Rate: 53 (04/07 0429)  Labs:  Recent Labs  11/29/14 1405 11/29/14 2202 11/30/14 0052 11/30/14 0439  WBC 10.1  --   --  7.1  HGB 11.1*  --   --  10.6*  PLT 163  --   --  182  CREATININE  --  2.38* 2.17* 2.23*    Estimated Creatinine Clearance: 18.1 mL/min (by C-G formula based on Cr of 2.23).  No results for input(s): VANCOTROUGH, VANCOPEAK, VANCORANDOM, GENTTROUGH, GENTPEAK, GENTRANDOM, TOBRATROUGH, TOBRAPEAK, TOBRARND, AMIKACINPEAK, AMIKACINTROU, AMIKACIN in the last 72 hours.   Microbiology: Recent Results (from the past 720 hour(s))  Urine culture     Status: None   Collection Time: 11/15/14  9:30 PM  Result Value Ref Range Status   Specimen Description URINE, CLEAN CATCH  Final   Special Requests NONE  Final   Colony Count   Final    >=100,000 COLONIES/ML Performed at [facility]    Culture   Final    [facility]    Report Status 11/18/2014 FINAL  Final   Organism ID, Bacteria MORGANELLA MORGANII  Final      Susceptibility   Morganella morganii - MIC*    AMPICILLIN >=32 RESISTANT Resistant     CEFAZOLIN >=64 RESISTANT Resistant     CEFTRIAXONE <=1 SENSITIVE Sensitive     CIPROFLOXACIN <=0.25 SENSITIVE Sensitive     GENTAMICIN <=1 SENSITIVE Sensitive     LEVOFLOXACIN <=0.12 SENSITIVE Sensitive     NITROFURANTOIN 128 RESISTANT Resistant     TOBRAMYCIN <=1 SENSITIVE Sensitive     TRIMETH/SULFA <=20 SENSITIVE Sensitive     PIP/TAZO <=4 SENSITIVE Sensitive     * MORGANELLA MORGANII   Medical History: Past Medical History  Diagnosis Date  . Hypercholesteremia   . Chewing tobacco  use    Anti-infectives    Start     Dose/Rate Route Frequency Ordered Stop   12/01/14 0600  vancomycin (VANCOCIN) IVPB 750 mg/150 ml premix     750 mg 150 mL/hr over 60 Minutes Intravenous Every 24 hours 11/30/14 1239     11/30/14 0715  vancomycin (VANCOCIN) IVPB 1000 mg/200 mL premix     1,000 mg 200 mL/hr over 60 Minutes Intravenous  Once 11/30/14 0702       Assessment: [age] yo male resident of [facility] admitted on 4/06 for leg swelling with metabolic abnormalities d/t dehydration. Pt [facility] s/p [facility] and treatment for UTI. Empiric antibiotics for pneumonia. Possible aspiration with swallow study pending.  Pt may need broader coverage than just Vancomycin. CXR show CHF but cannot exclude superimposed pna.  Pt is afebrile with normal WBC.  SCr is elevated.    Goal of Therapy:  Vancomycin troughs 15-20 mcg/ml  Plan:  Protocol initiated with a one-time dose of Vancomycin 1 Gm IV.   Vancomycin 750mg  IV q24hrs as maintenance dose Check trough level at steady state Monitor labs, renal fxn, and cultures Recommend to broaden coverage if deemed clinically appropriate (add Zosyn)  Wayland DenisHall, Charon Smedberg A, RPH 11/30/2014,12:43 PM

## 2014-11-30 NOTE — Progress Notes (Signed)
  Echocardiogram 2D Echocardiogram has been performed.  Stacey DrainWhite, Cambren Helm J 11/30/2014, 2:43 PM

## 2014-12-01 ENCOUNTER — Inpatient Hospital Stay (HOSPITAL_COMMUNITY): Payer: Medicare Other

## 2014-12-01 LAB — BASIC METABOLIC PANEL
Anion gap: 7 (ref 5–15)
BUN: 42 mg/dL — ABNORMAL HIGH (ref 6–23)
CHLORIDE: 112 mmol/L (ref 96–112)
CO2: 26 mmol/L (ref 19–32)
Calcium: 7.8 mg/dL — ABNORMAL LOW (ref 8.4–10.5)
Creatinine, Ser: 2.09 mg/dL — ABNORMAL HIGH (ref 0.50–1.35)
GFR calc non Af Amer: 24 mL/min — ABNORMAL LOW (ref >90)
GFR, EST AFRICAN AMERICAN: 28 mL/min — AB (ref >90)
Glucose, Bld: 93 mg/dL (ref 70–99)
POTASSIUM: 3.6 mmol/L (ref 3.5–5.1)
Sodium: 145 mmol/L (ref 135–145)

## 2014-12-01 LAB — GLUCOSE, CAPILLARY: Glucose-Capillary: 107 mg/dL — ABNORMAL HIGH (ref 70–99)

## 2014-12-01 LAB — MRSA PCR SCREENING: MRSA by PCR: NEGATIVE

## 2014-12-01 NOTE — Clinical Social Work Note (Signed)
CSW updated [facility] on pt. Facility remains willing for return when pt medically ready and can accept over weekend.   Derenda FennelKara Lian Pounds, [place] [phone]

## 2014-12-01 NOTE — Progress Notes (Signed)
Patient pulled IV out from left forearm, skin tear present from tape, cleaned area and applied pink foam dressing.  New IV placed in right forearm, wrapped with gauze to protect skin.

## 2014-12-01 NOTE — Progress Notes (Signed)
Subjective: The patient is drowsy but responds to spoken voice. He does have cardiomegaly and right pleural effusion and peripheral edema. And possibly early pneumonia. He does have diastolic congestive heart failure and is diuresing.  Objective: Vital signs in last 24 hours: Temp:  [97.7 F (36.5 C)-97.9 F (36.6 C)] 97.7 F (36.5 C) (04/07 1952) Pulse Rate:  [60-95] 60 (04/07 1952) Resp:  [18] 18 (04/07 1952) BP: (98-103)/(52-55) 103/55 mmHg (04/07 1952) SpO2:  [90 %-95 %] 90 % (04/07 1952) Weight change:  Last BM Date:  (UTA, pt unsure)  Intake/Output from previous day: 04/07 0701 - 04/08 0700 In: 2401.8 [P.O.:360; I.V.:1841.8; IV Piggyback:200] Out: 300 [Urine:300] Intake/Output this shift: Total I/O In: 120 [P.O.:120] Out: 300 [Urine:300]  Physical Exam: Gen. appearance-patient is fairly comfortable  HEENT negative  Neck supple no JVD or thyroid abnormalities  Heart irregular rhythm no obvious cardiomegaly  Lungs occasional rhonchus heard over lower lung field  Abdomen no palpable organs or masses  Extremities 2+ edema  Skin diffuse ecchymosis over upper extremities and trunk   Recent Labs  11/29/14 1405 11/30/14 0439  WBC 10.1 7.1  HGB 11.1* 10.6*  HCT 34.4* 32.3*  PLT 163 182   BMET  Recent Labs  11/30/14 0052 11/30/14 0439  NA 146* CANCELLED BY LAB  148*  K 3.9 3.7  CL 116* 117*  CO2 25 25  GLUCOSE 108* 97  BUN 47* 46*  CREATININE 2.17* 2.23*  CALCIUM 7.8* 8.0*    Studies/Results: Dg Chest Portable 1 View  11/29/2014   CLINICAL DATA:  Lower extremity edema.  EXAM: PORTABLE CHEST - 1 VIEW  COMPARISON:  November 15, 2014  FINDINGS: There is cardiomegaly with right effusion. There is mild generalized interstitial edema. There is airspace consolidation in both lower lobes. There is slight pulmonary venous hypertension. No adenopathy. Bones are osteoporotic with degenerative change in each shoulder. Patient has had kyphoplasty procedures in the  lower thoracic and upper lumbar spine regions.  IMPRESSION: Congestive heart failure. Cannot exclude superimposed pneumonia in the bases. Both entities may exist concurrently.   Electronically Signed   By: Bretta BangWilliam  Woodruff III M.D.   On: 11/29/2014 14:35    Medications:  . aspirin EC  81 mg Oral Daily  . ferrous sulfate  325 mg Oral BID  . heparin  5,000 Units Subcutaneous 3 times per day  . simvastatin  20 mg Oral q1800  . sodium chloride  3 mL Intravenous Q12H  . torsemide  10 mg Oral Daily  . vancomycin  750 mg Intravenous Q24H    . dextrose 1 mL (11/30/14 2152)     Assessment/Plan: 1. Congestive heart failure probable diastolic with elevated pro BNP-2 continue to monitor electrolytes continue diuresis continue to monitor potassium and sodium 2. Probable early pneumonia plan to continue IV vancomycin monitored by pharmacy  3. Prior rib fractures-no significant discomfort  4. Chronic atrial fibrillation-no plan to anticoagulate patient continue current regimen    LOS: 2 days   Karsin Pesta G 12/01/2014, 6:15 AM

## 2014-12-02 LAB — BASIC METABOLIC PANEL
Anion gap: 5 (ref 5–15)
BUN: 38 mg/dL — ABNORMAL HIGH (ref 6–23)
CALCIUM: 7.7 mg/dL — AB (ref 8.4–10.5)
CO2: 26 mmol/L (ref 19–32)
CREATININE: 2.01 mg/dL — AB (ref 0.50–1.35)
Chloride: 108 mmol/L (ref 96–112)
GFR calc non Af Amer: 26 mL/min — ABNORMAL LOW (ref >90)
GFR, EST AFRICAN AMERICAN: 30 mL/min — AB (ref >90)
Glucose, Bld: 103 mg/dL — ABNORMAL HIGH (ref 70–99)
Potassium: 3.5 mmol/L (ref 3.5–5.1)
Sodium: 139 mmol/L (ref 135–145)

## 2014-12-02 MED ORDER — TORSEMIDE 20 MG PO TABS
20.0000 mg | ORAL_TABLET | Freq: Every day | ORAL | Status: DC
  Administered 2014-12-02 – 2014-12-04 (×3): 20 mg via ORAL
  Filled 2014-12-02 (×3): qty 1

## 2014-12-02 MED ORDER — BACITRACIN-NEOMYCIN-POLYMYXIN 400-5-5000 EX OINT
TOPICAL_OINTMENT | Freq: Two times a day (BID) | CUTANEOUS | Status: DC
  Administered 2014-12-02 (×2): 1 via TOPICAL
  Administered 2014-12-03: 2 via TOPICAL
  Administered 2014-12-03 – 2014-12-04 (×2): 1 via TOPICAL
  Filled 2014-12-02: qty 2
  Filled 2014-12-02: qty 1
  Filled 2014-12-02 (×2): qty 2
  Filled 2014-12-02: qty 1

## 2014-12-02 MED ORDER — LEVOFLOXACIN IN D5W 750 MG/150ML IV SOLN
750.0000 mg | Freq: Once | INTRAVENOUS | Status: AC
  Administered 2014-12-02: 750 mg via INTRAVENOUS
  Filled 2014-12-02: qty 150

## 2014-12-02 MED ORDER — BACITRACIN-NEOMYCIN-POLYMYXIN 400-5-5000 EX OINT: TOPICAL_OINTMENT | CUTANEOUS | Status: DC | PRN

## 2014-12-02 MED ORDER — LEVOFLOXACIN 500 MG PO TABS
500.0000 mg | ORAL_TABLET | ORAL | Status: DC
  Administered 2014-12-04: 500 mg via ORAL
  Filled 2014-12-02: qty 1

## 2014-12-02 NOTE — Progress Notes (Signed)
Subjective: The patient is more alert today. He does have cardiomegaly right pleural effusion and peripheral edema and probable early pneumonia. He does have a history of diastolic congestive heart failure and is diuresing. He has chronic renal failure as well  Objective: Vital signs in last 24 hours: Temp:  [97.6 F (36.4 C)-97.8 F (36.6 C)] 97.7 F (36.5 C) (04/09 0504) Pulse Rate:  [51-72] 51 (04/09 0504) Resp:  [16-20] 20 (04/09 0504) BP: (97-113)/(52-65) 113/65 mmHg (04/09 0504) SpO2:  [96 %-99 %] 99 % (04/09 0504) Weight:  [73 kg (160 lb 15 oz)] 73 kg (160 lb 15 oz) (04/09 0447) Weight change: 1 kg (2 lb 3.3 oz) Last BM Date:  (UTA, pt unsure)  Intake/Output from previous day: 04/08 0701 - 04/09 0700 In: 1487.5 [P.O.:720; I.V.:767.5] Out: -  Intake/Output this shift:    Physical Exam: Gen. appearance-the patient is fairly comfortable  HEENT negative  Neck supple with increased JVD no thyroid abnormalities  Heart irregular rhythm no obvious cardiomegaly  Lungs occasional rhonchus heard over lower lung field  Abdomen no palpable organs or masses  Extremities 1-2+ edema  Skin diffuse ecchymoses over upper extremities and trunk   Recent Labs  11/29/14 1405 11/30/14 0439  WBC 10.1 7.1  HGB 11.1* 10.6*  HCT 34.4* 32.3*  PLT 163 182   BMET  Recent Labs  12/01/14 0432 12/02/14 0631  NA 145 139  K 3.6 3.5  CL 112 108  CO2 26 26  GLUCOSE 93 103*  BUN 42* 38*  CREATININE 2.09* 2.01*  CALCIUM 7.8* 7.7*    Studies/Results: Dg Chest Port 1 View  12/01/2014   CLINICAL DATA:  Left lower lobe pneumonia.  EXAM: PORTABLE CHEST - 1 VIEW  COMPARISON:  11/29/2014  FINDINGS: There is bilateral diffuse interstitial thickening. There are bilateral small pleural effusions. There is bilateral lower lobe and perihilar airspace disease. There is no pneumothorax. There is stable cardiomegaly. Is no acute osseous abnormality.  IMPRESSION: 1. Bilateral interstitial  thickening, patchy airspace opacities and pleural effusions with cardiomegaly. Differential diagnosis includes florid pulmonary edema versus multilobar pneumonia.   Electronically Signed   By: Elige KoHetal  Patel   On: 12/01/2014 08:06    Medications:  . aspirin EC  81 mg Oral Daily  . ferrous sulfate  325 mg Oral BID  . heparin  5,000 Units Subcutaneous 3 times per day  . simvastatin  20 mg Oral q1800  . sodium chloride  3 mL Intravenous Q12H  . torsemide  10 mg Oral Daily  . vancomycin  750 mg Intravenous Q24H    . dextrose 75 mL/hr at 12/02/14 0155     Assessment/Plan: 1. Congestive heart failure diastolic with elevated pro BNP-plan to reinstitute furosemide-continue torsemide-continue to monitor chemistries  2. Probable early pneumonia to continue vancomycin as monitored by pharmacy  3. Prior rib fractures significant discomfort  4. Chronic atrial fibrillation not candidate for anticoagulation   LOS: 3 days   Lora Glomski G 12/02/2014, 7:33 AM

## 2014-12-02 NOTE — Progress Notes (Signed)
ANTIBIOTIC CONSULT NOTE-follow up  Pharmacy Consult for Levaquin Indication: Pneumonia  No Known Allergies  Patient Measurements: Height: 6' (182.9 cm) Weight: 160 lb 15 oz (73 kg) IBW/kg (Calculated) : 77.6  Vital Signs: Temp: 97.7 F (36.5 C) (04/09 0504) Temp Source: Oral (04/09 0504) BP: 113/65 mmHg (04/09 0504) Pulse Rate: 51 (04/09 0504)  Labs:  Recent Labs  11/29/14 1405  11/30/14 0439 12/01/14 0432 12/02/14 0631  WBC 10.1  --  7.1  --   --   HGB 11.1*  --  10.6*  --   --   PLT 163  --  182  --   --   CREATININE  --   < > 2.23* 2.09* 2.01*  < > = values in this interval not displayed.  Estimated Creatinine Clearance: 20.2 mL/min (by C-G formula based on Cr of 2.01).  No results for input(s): VANCOTROUGH, VANCOPEAK, VANCORANDOM, GENTTROUGH, GENTPEAK, GENTRANDOM, TOBRATROUGH, TOBRAPEAK, TOBRARND, AMIKACINPEAK, AMIKACINTROU, AMIKACIN in the last 72 hours.   Microbiology: Recent Results (from the past 720 hour(s))  Urine culture     Status: None   Collection Time: 11/15/14  9:30 PM  Result Value Ref Range Status   Specimen Description URINE, CLEAN CATCH  Final   Special Requests NONE  Final   Colony Count   Final    >=100,000 COLONIES/ML Performed at [facility]    Culture   Final    [facility]formed at [facility]    Report Status 11/18/2014 FINAL  Final   Organism ID, Bacteria MORGANELLA MORGANII  Final      Susceptibility   Morganella morganii - MIC*    AMPICILLIN >=32 RESISTANT Resistant     CEFAZOLIN >=64 RESISTANT Resistant     CEFTRIAXONE <=1 SENSITIVE Sensitive     CIPROFLOXACIN <=0.25 SENSITIVE Sensitive     GENTAMICIN <=1 SENSITIVE Sensitive     LEVOFLOXACIN <=0.12 SENSITIVE Sensitive     NITROFURANTOIN 128 RESISTANT Resistant     TOBRAMYCIN <=1 SENSITIVE Sensitive     TRIMETH/SULFA <=20 SENSITIVE Sensitive     PIP/TAZO <=4 SENSITIVE Sensitive     * MORGANELLA MORGANII  Culture, Urine     Status: None   Collection Time: 11/29/14  4:05 PM  Result Value Ref Range Status   Specimen Description URINE, CATHETERIZED  Final   Special Requests NONE  Final   Colony Count NO GROWTH Performed at [facility]   Final   Culture NO GROWTH Performed at [facility]   Final   Report Status 11/30/2014 FINAL  Final  MRSA PCR Screening     Status: None   Collection Time: 12/01/14  6:25 PM  Result Value Ref Range Status   MRSA by PCR NEGATIVE NEGATIVE Final    Comment:        The GeneXpert MRSA Assay (FDA approved for NASAL specimens only), is one component of a comprehensive MRSA colonization surveillance program. It is not intended to diagnose MRSA infection nor to guide or monitor treatment for MRSA infections.    Medical History: Past Medical History  Diagnosis Date  . Hypercholesteremia   . Chewing tobacco use    Anti-infectives    Start     Dose/Rate Route Frequency Ordered Stop   March 06, 2015 0800  levofloxacin (LEVAQUIN) tablet 500 mg     500 mg Oral Every 48 hours 12/02/14 0748     12/02/14 0800  levofloxacin (LEVAQUIN) IVPB 750 mg     750 mg 100 mL/hr  over 90 Minutes Intravenous  Once 12/02/14 0748     12/01/14 0600  vancomycin (VANCOCIN) IVPB 750 mg/150 ml premix  Status:  Discontinued     750 mg 150 mL/hr over 60 Minutes Intravenous Every 24 hours 11/30/14 1239 12/02/14 0747   11/30/14 0715  vancomycin (VANCOCIN) IVPB 1000 mg/200 mL premix     1,000 mg 200 mL/hr over 60 Minutes Intravenous  Once 11/30/14 1610 11/30/14 1323     Assessment: 81 yo male resident of [facility] admitted on 4/06 for leg swelling with metabolic abnormalities d/t dehydration.  Empiric antibiotics for pneumonia.  CXR now shows Bilateral interstitial thickening, patchy airspace opacities and pleural effusions with cardiomegaly. Differential diagnosis includes florid pulmonary edema versus multilobar pneumonia. Pt is afebrile with a recent normal WBC.  SCr remains elevated, but slightly  better this AM.   Discussed with Dr. Renard Matter, will transition patient to Levaquin therapy.  Goal of Therapy:  Eradicate infection.   Plan:  Levaquin  IV x 1, then  PO every 48 hours. Monitor labs, renal fxn, and cultures  Mady Gemma, [facility] 12/02/2014,8:13 AM

## 2014-12-02 NOTE — Progress Notes (Signed)
Notified Dr. Renard MatterMcinnis that the patient had redness to his penis on the glans penis.  Recommendations neosporin to the site as needed.  New orders given and followed.

## 2014-12-03 ENCOUNTER — Inpatient Hospital Stay (HOSPITAL_COMMUNITY): Payer: Medicare Other

## 2014-12-03 LAB — BASIC METABOLIC PANEL
ANION GAP: 5 (ref 5–15)
BUN: 38 mg/dL — ABNORMAL HIGH (ref 6–23)
CHLORIDE: 105 mmol/L (ref 96–112)
CO2: 26 mmol/L (ref 19–32)
Calcium: 7.7 mg/dL — ABNORMAL LOW (ref 8.4–10.5)
Creatinine, Ser: 1.9 mg/dL — ABNORMAL HIGH (ref 0.50–1.35)
GFR calc non Af Amer: 27 mL/min — ABNORMAL LOW (ref >90)
GFR, EST AFRICAN AMERICAN: 32 mL/min — AB (ref >90)
Glucose, Bld: 98 mg/dL (ref 70–99)
Potassium: 3.5 mmol/L (ref 3.5–5.1)
SODIUM: 136 mmol/L (ref 135–145)

## 2014-12-03 NOTE — Progress Notes (Signed)
Subjective: The patient is more alert today. He continues to diurese. He has chronic renal failure which is improving. He does have cardiomegaly right pleural effusion and peripheral edema and probable early pneumonia.  Objective: Vital signs in last 24 hours: Temp:  [97.5 F (36.4 C)-98.1 F (36.7 C)] 97.5 F (36.4 C) (04/10 16100638) Pulse Rate:  [52-69] 54 (04/10 0638) Resp:  [20] 20 (04/10 0638) BP: (104-106)/(53-61) 106/61 mmHg (04/10 0638) SpO2:  [91 %-95 %] 95 % (04/10 [id number]) Weight:  [73.5 kg (162 lb 0.6 oz)] 73.5 kg (162 lb 0.6 oz) (04/10 0500) Weight change: 0.5 kg (1 lb 1.6 oz) Last BM Date:  (UTA, pt unsure)  Intake/Output from previous day: 04/09 0701 - 04/10 0700 In: 760 [P.O.:360; I.V.:400] Out: -  Intake/Output this shift:    Physical Exam: Gen. appearance-the patient is fairly comfortable  HEENT negative  Neck supple with increased JVD no thyroid abnormalities  Heart irregular rhythm  Lungs occasional rhonchus heard over lower lung field  Abdomen no palpable organs or masses  Extremities 1+ edema  Skin diffuse ecchymosis over upper extremities and trunk  No results for input(s): WBC, HGB, HCT, PLT in the last 72 hours. BMET  Recent Labs  12/02/14 0631 12/03/14 0511  NA 139 136  K 3.5 3.5  CL 108 105  CO2 26 26  GLUCOSE 103* 98  BUN 38* 38*  CREATININE 2.01* 1.90*  CALCIUM 7.7* 7.7*    Studies/Results: No results found.  Medications:  . aspirin EC  81 mg Oral Daily  . ferrous sulfate  325 mg Oral BID  . heparin  5,000 Units Subcutaneous 3 times per day  . [START ON June 07, 2015] levofloxacin  500 mg Oral Q48H  . neomycin-bacitracin-polymyxin   Topical BID  . simvastatin  20 mg Oral q1800  . sodium chloride  3 mL Intravenous Q12H  . torsemide  20 mg Oral Daily    . dextrose 50 mL/hr at 12/02/14 2225     Assessment/Plan: 1. Congestive heart failure diastolic with elevated pro BNP-plan to continue torsemide in larger dosage-continue to  monitor chemistries  2. Probable early pneumonia to continue antibiotic Levaquin  3. Prior rib fractures continue to give medication for pain if needed  4. Chronic atrial fibrillation not a candidate for anticoagulation   LOS: 4 days   Linlee Cromie G 12/03/2014, 10:12 AM

## 2014-12-04 ENCOUNTER — Inpatient Hospital Stay
Admission: RE | Admit: 2014-12-04 | Discharge: 2015-01-24 | Disposition: E | Payer: Medicare Other | Source: Ambulatory Visit | Attending: Family Medicine | Admitting: Family Medicine

## 2014-12-04 ENCOUNTER — Inpatient Hospital Stay
Admission: RE | Admit: 2014-12-04 | Payer: BLUE CROSS/BLUE SHIELD | Source: Ambulatory Visit | Admitting: Internal Medicine

## 2014-12-04 LAB — BASIC METABOLIC PANEL
Anion gap: 7 (ref 5–15)
BUN: 37 mg/dL — AB (ref 6–23)
CHLORIDE: 103 mmol/L (ref 96–112)
CO2: 26 mmol/L (ref 19–32)
Calcium: 7.9 mg/dL — ABNORMAL LOW (ref 8.4–10.5)
Creatinine, Ser: 2 mg/dL — ABNORMAL HIGH (ref 0.50–1.35)
GFR calc Af Amer: 30 mL/min — ABNORMAL LOW (ref >90)
GFR calc non Af Amer: 26 mL/min — ABNORMAL LOW (ref >90)
GLUCOSE: 99 mg/dL (ref 70–99)
POTASSIUM: 3.2 mmol/L — AB (ref 3.5–5.1)
Sodium: 136 mmol/L (ref 135–145)

## 2014-12-04 MED ORDER — BISACODYL 10 MG RE SUPP
10.0000 mg | Freq: Once | RECTAL | Status: AC
  Administered 2014-12-04: 10 mg via RECTAL
  Filled 2014-12-04: qty 1

## 2014-12-04 MED ORDER — LEVOFLOXACIN 500 MG PO TABS: 500.0000 mg | ORAL_TABLET | ORAL | Status: AC

## 2014-12-04 MED ORDER — TORSEMIDE 20 MG PO TABS: 20.0000 mg | ORAL_TABLET | Freq: Every day | ORAL | Status: AC

## 2014-12-04 NOTE — Progress Notes (Signed)
UR chart review completed.  

## 2014-12-04 NOTE — Discharge Summary (Signed)
Physician Discharge Summary  [name]:[id number] DOB: Nov 11, 1911 DOA: 11/29/2014  PCP: Alice Reichert, MD  Admit date: 11/29/2014 Discharge date: 12/12/2014     Discharge Diagnoses:  Diastolic congestive heart failure Bronchopneumonia Chronic atrial fibrillation Chronic renal failure stage II Hypokalemic  Hypernatremia Prior rib fractures secondary to fall Urinary tract infection improved    Discharge Condition: Stable Disposition: Discharged to nursing facility Diet recommendation: 2 g sodium diet  Filed Weights   12/02/14 0447 12/03/14 0500 12/14/2014 0600  Weight: 73 kg (160 lb 15 oz) 73.5 kg (162 lb 0.6 oz) 74.5 kg (164 lb 3.9 oz)    History of present illness:  The patient was admitted from nursing home with a history of chronic renal insufficiency recent fall and rib fractures recent urinary tract infection being treated. He had been experiencing extremity edema and in ED was noted have extremely low serum potassium and high serum sodium his Lasix had been increased to 60 mg without improvement in lower extremity swelling. The patient's potassium was repleted,  he was started on intravenous fluids and subsequently admitted. The patient is known to have diastolic congestive heart failure. The patient was admitted to [facility] floor Hospital Course:  Examination on admission revealed, a comfortable male-heart 3/6 systolic murmur irregular rhythm-lungs minimal crackles bilaterally at bases-extremities reveal 2-3+ pitting edema. The patient's potassium was repleted initially. X-rays CHEST revealed evidence of pulmonary edema and probable multi lobar pneumonia. Patient was started on Levaquin 500 mg daily and diuresed with torsemide initially 10 mg and 20 mg daily. The patient diuresed with this medication. He clinically improved and improved by x-ray as well. The patient did have a BNP of 381. His electrolyte disorder with elevated serum sodium and low serum potassium did  improve. The patient is 81 years of age family did not want further cardiac workup. It was felt that he could be discharged back to nursing facility since he become more stable   Discharge Instructions The patient is to continue medications listed below. His chemistries will be monitored    Medication List    STOP taking these medications        furosemide 40 MG tablet  Commonly known as:  LASIX     potassium chloride SA 20 MEQ tablet  Commonly known as:  K-DUR,KLOR-CON      TAKE these medications        aspirin EC 81 MG tablet  Take 81 mg by mouth daily.     ferrous sulfate 325 (65 FE) MG tablet  Take 325 mg by mouth 2 (two) times daily.     levofloxacin 500 MG tablet  Commonly known as:  LEVAQUIN  Take 1 tablet (500 mg total) by mouth every other day.     simvastatin 20 MG tablet  Commonly known as:  ZOCOR  Take 20 mg by mouth daily.     torsemide 20 MG tablet  Commonly known as:  DEMADEX  Take 1 tablet (20 mg total) by mouth daily.       No Known Allergies  The results of significant diagnostics from this hospitalization (including imaging, microbiology, ancillary and laboratory) are listed below for reference.    Significant Diagnostic Studies: Dg Ribs Unilateral W/chest Right  11/15/2014   CLINICAL DATA:  Status post fall from wheelchair onto floor. Right anterior and lateral rib pain. Initial encounter.  EXAM: RIGHT RIBS AND CHEST - 3+ VIEW  COMPARISON:  Chest radiograph performed 03/04/2010  FINDINGS: Fractures through the right anterolateral fifth  through eighth ribs are suspected, though some of these may be chronic in nature.  Patchy bilateral atelectasis is noted, with underlying scarring. Increased interstitial markings are likely transient in nature. No definite pleural effusion or pneumothorax is seen.  The cardiomediastinal silhouette is enlarged. The patient is status post vertebroplasty at multiple levels along the thoracic spine.  IMPRESSION: 1.  Fractures through the right anterolateral fifth through eighth ribs suspected, though some of these may be chronic in nature. 2. Patchy bilateral atelectasis, with underlying scarring. Increased interstitial markings are likely transient in nature. 3. Cardiomegaly noted.   Electronically Signed   By: Roanna Raider M.D.   On: 11/15/2014 21:38   Ct Head Wo Contrast  11/15/2014   CLINICAL DATA:  Fall from wheelchair yesterday, dementia  EXAM: CT HEAD WITHOUT CONTRAST  CT CERVICAL SPINE WITHOUT CONTRAST  TECHNIQUE: Multidetector CT imaging of the head and cervical spine was performed following the standard protocol without intravenous contrast. Multiplanar CT image reconstructions of the cervical spine were also generated.  COMPARISON:  11/20/2013  FINDINGS: CT HEAD FINDINGS  No skull fracture is noted. Paranasal sinuses and mastoid air cells are unremarkable. Atherosclerotic calcifications of carotid siphon are again noted. No intracranial hemorrhage, mass effect or midline shift. Moderate cerebral atrophy. Extensive periventricular and subcortical white matter decreased attenuation consistent with chronic small vessel ischemic changes again noted.  CT CERVICAL SPINE FINDINGS  Axial images of the cervical spine shows no acute fracture or subluxation. There is diffuse osteopenia. There is no pneumothorax in visualized lung apices. Computer processed images shows no acute fracture or subluxation. Degenerative changes are noted C1-C2 articulation. There is mild disc space flattening with minimal posterior spurring at C3-C4 level. Mild disc space flattening at C4-C5 and C5-C6 level. Multilevel facet degenerative changes are noted. Mild anterior spurring lower endplate of C6 vertebral body. There is probable chronic mild compression deformity upper endplate of T1 vertebral body. No definite evidence of acute fracture.  No prevertebral soft tissue swelling.  Cervical airway is patent.  IMPRESSION: 1. No acute  intracranial abnormality. Moderate cerebral atrophy. Extensive chronic white matter disease. No definite acute cortical infarction. 2. No cervical spine acute fracture or subluxation. Diffuse osteopenia. Multilevel degenerative changes as described above. Probable chronic mild compression deformity upper endplate of T1 vertebral body.   Electronically Signed   By: Natasha Mead M.D.   On: 11/15/2014 21:50   Ct Cervical Spine Wo Contrast  11/15/2014   CLINICAL DATA:  Fall from wheelchair yesterday, dementia  EXAM: CT HEAD WITHOUT CONTRAST  CT CERVICAL SPINE WITHOUT CONTRAST  TECHNIQUE: Multidetector CT imaging of the head and cervical spine was performed following the standard protocol without intravenous contrast. Multiplanar CT image reconstructions of the cervical spine were also generated.  COMPARISON:  11/20/2013  FINDINGS: CT HEAD FINDINGS  No skull fracture is noted. Paranasal sinuses and mastoid air cells are unremarkable. Atherosclerotic calcifications of carotid siphon are again noted. No intracranial hemorrhage, mass effect or midline shift. Moderate cerebral atrophy. Extensive periventricular and subcortical white matter decreased attenuation consistent with chronic small vessel ischemic changes again noted.  CT CERVICAL SPINE FINDINGS  Axial images of the cervical spine shows no acute fracture or subluxation. There is diffuse osteopenia. There is no pneumothorax in visualized lung apices. Computer processed images shows no acute fracture or subluxation. Degenerative changes are noted C1-C2 articulation. There is mild disc space flattening with minimal posterior spurring at C3-C4 level. Mild disc space flattening at C4-C5 and C5-C6 level. Multilevel  facet degenerative changes are noted. Mild anterior spurring lower endplate of C6 vertebral body. There is probable chronic mild compression deformity upper endplate of T1 vertebral body. No definite evidence of acute fracture.  No prevertebral soft tissue  swelling.  Cervical airway is patent.  IMPRESSION: 1. No acute intracranial abnormality. Moderate cerebral atrophy. Extensive chronic white matter disease. No definite acute cortical infarction. 2. No cervical spine acute fracture or subluxation. Diffuse osteopenia. Multilevel degenerative changes as described above. Probable chronic mild compression deformity upper endplate of T1 vertebral body.   Electronically Signed   By: Natasha Mead M.D.   On: 11/15/2014 21:50   US Venous Img Lower Bilateral  11/16/2014   CLINICAL DATA:  Bilateral lower extremity edema.  EXAM: BILATERAL LOWER EXTREMITY VENOUS DOPPLER ULTRASOUND  TECHNIQUE: Gray-scale sonography with graded compression, as well as color Doppler and duplex ultrasound were performed to evaluate the lower extremity deep venous systems from the level of the common femoral vein and including the common femoral, femoral, profunda femoral, popliteal and calf veins including the posterior tibial, peroneal and gastrocnemius veins when visible. The superficial great saphenous vein was also interrogated. Spectral Doppler was utilized to evaluate flow at rest and with distal augmentation maneuvers in the common femoral, femoral and popliteal veins.  COMPARISON:  None.  FINDINGS: RIGHT LOWER EXTREMITY  Common Femoral Vein: No evidence of thrombus. Normal compressibility, respiratory phasicity and response to augmentation.  Saphenofemoral Junction: No evidence of thrombus. Normal compressibility and flow on color Doppler imaging.  Profunda Femoral Vein: No evidence of thrombus. Normal compressibility and flow on color Doppler imaging.  Femoral Vein: No evidence of thrombus. Normal compressibility, respiratory phasicity and response to augmentation.  Popliteal Vein: No evidence of thrombus. Normal compressibility, respiratory phasicity and response to augmentation.  Calf Veins: No evidence of thrombus. Normal compressibility and flow on color Doppler imaging.  Superficial  Great Saphenous Vein: No evidence of thrombus. Normal compressibility and flow on color Doppler imaging.  Venous Reflux:  None.  Other Findings: No evidence of superficial thrombophlebitis or abnormal fluid collection.  LEFT LOWER EXTREMITY  Common Femoral Vein: No evidence of thrombus. Normal compressibility, respiratory phasicity and response to augmentation.  Saphenofemoral Junction: No evidence of thrombus. Normal compressibility and flow on color Doppler imaging.  Profunda Femoral Vein: No evidence of thrombus. Normal compressibility and flow on color Doppler imaging.  Femoral Vein: No evidence of thrombus. Normal compressibility, respiratory phasicity and response to augmentation.  Popliteal Vein: No evidence of thrombus. Normal compressibility, respiratory phasicity and response to augmentation.  Calf Veins: No evidence of thrombus. Normal compressibility and flow on color Doppler imaging.  Superficial Great Saphenous Vein: No evidence of thrombus. Normal compressibility and flow on color Doppler imaging.  Venous Reflux:  None.  Other Findings: No evidence of superficial thrombophlebitis or abnormal fluid collection.  IMPRESSION: No evidence of bilateral lower extremity deep venous thrombosis.   Electronically Signed   By: Irish Lack M.D.   On: 11/16/2014 16:15   Dg Chest Port 1 View  12/03/2014   CLINICAL DATA:  Shortness of breath, fluid overload.  EXAM: PORTABLE CHEST - 1 VIEW  COMPARISON:  12/01/2014 and prior studies  FINDINGS: Cardiomegaly again noted.  Bilateral airspace opacities/ edema has decreased.  Continued bilateral lower lung atelectasis/consolidation and probable effusions noted.  There is no evidence of pneumothorax.  IMPRESSION: Decreased bilateral airspace opacities/edema.  Continued cardiomegaly, bilateral lower lung atelectasis/consolidation and probable effusions.   Electronically Signed   By: Harmon Pier  M.D.   On: 12/03/2014 12:23   Dg Chest Port 1 View  12/01/2014   CLINICAL  DATA:  Left lower lobe pneumonia.  EXAM: PORTABLE CHEST - 1 VIEW  COMPARISON:  11/29/2014  FINDINGS: There is bilateral diffuse interstitial thickening. There are bilateral small pleural effusions. There is bilateral lower lobe and perihilar airspace disease. There is no pneumothorax. There is stable cardiomegaly. Is no acute osseous abnormality.  IMPRESSION: 1. Bilateral interstitial thickening, patchy airspace opacities and pleural effusions with cardiomegaly. Differential diagnosis includes florid pulmonary edema versus multilobar pneumonia.   Electronically Signed   By: Elige KoHetal  Patel   On: 12/01/2014 08:06   Dg Chest Portable 1 View  11/29/2014   CLINICAL DATA:  Lower extremity edema.  EXAM: PORTABLE CHEST - 1 VIEW  COMPARISON:  November 15, 2014  FINDINGS: There is cardiomegaly with right effusion. There is mild generalized interstitial edema. There is airspace consolidation in both lower lobes. There is slight pulmonary venous hypertension. No adenopathy. Bones are osteoporotic with degenerative change in each shoulder. Patient has had kyphoplasty procedures in the lower thoracic and upper lumbar spine regions.  IMPRESSION: Congestive heart failure. Cannot exclude superimposed pneumonia in the bases. Both entities may exist concurrently.   Electronically Signed   By: Bretta BangWilliam  Woodruff III M.D.   On: 11/29/2014 14:35   Dg Knee Complete 4 Views Left  11/16/2014   CLINICAL DATA:  One 28hundred year old male post fall at home. Initial encounter.  EXAM: LEFT KNEE - COMPLETE 4+ VIEW  COMPARISON:  None.  FINDINGS: The bones are under mineralized. There is medial and lateral tibial femoral joint space narrowing and chondrocalcinosis. Mild spurring of the patella. No acute fracture. No joint effusion. Vascular calcifications are seen.  IMPRESSION: Degenerative change of the left knee without acute fracture.   Electronically Signed   By: Rubye OaksMelanie  Ehinger M.D.   On: 11/16/2014 01:06   Dg Knee Complete 4 Views  Right  11/15/2014   CLINICAL DATA:  Larey SeatFell from wheelchair to floor, with right knee pain. Initial encounter.  EXAM: RIGHT KNEE - COMPLETE 4+ VIEW  COMPARISON:  Right knee radiographs performed 03/14/2009  FINDINGS: There is no evidence of fracture or dislocation. The joint spaces are preserved. Small marginal osteophytes are seen at the lateral and patellofemoral compartments.  Trace knee joint fluid remains within normal limits. Scattered vascular calcifications are seen.  IMPRESSION: 1. No evidence of fracture or dislocation. 2. Minimal osteoarthritis at the right knee. 3. Scattered vascular calcifications seen.   Electronically Signed   By: Roanna RaiderJeffery  Chang M.D.   On: 11/15/2014 22:19   Dg Hand Complete Right  11/15/2014   CLINICAL DATA:  Larey SeatFell from wheelchair onto floor, with right hand pain and skin tear between the thumb and index finger. Initial encounter.  EXAM: RIGHT HAND - COMPLETE 3+ VIEW  COMPARISON:  None.  FINDINGS: The known soft tissue laceration is difficult to fully characterize on radiograph. No radiopaque foreign bodies are seen.  There is diffuse osteopenia of visualized osseous structures. Degenerative change is noted at the first carpometacarpal joint, and mild degenerative change is noted at the proximal carpal row. Calcification of the triangular fibrocartilage is noted. There is no evidence of fracture or dislocation.  IMPRESSION: 1. No radiopaque foreign bodies seen. 2. Diffuse osteopenia of visualized osseous structures. 3. No evidence of fracture or dislocation. 4. Osteoarthritis of the right hand, with calcification of the triangular fibrocartilage.   Electronically Signed   By: Beryle BeamsJeffery  Chang M.D.  On: 11/15/2014 22:12   Dg Hip Unilat With Pelvis 2-3 Views Left  11/16/2014   CLINICAL DATA:  One 81 year old male post fall at home, initial encounter.  EXAM: LEFT HIP (WITH PELVIS) 2-3 VIEWS  COMPARISON:  None.  FINDINGS: The bones are significantly under mineralized. Postsurgical  change with lateral plate and screw fixation of the right hip. The cortical margins of the bony pelvis and left hip are intact. No displaced fracture. Pubic symphysis and sacroiliac joints are congruent. Both femoral heads are well-seated in the respective acetabula.  IMPRESSION: No displaced left hip fracture. The bones are significantly under mineralized. If there is high clinical suspicion for occult hip fracture or the patient refuses to weightbear, consider further evaluation with MRI. Although CT is expeditious, evidence is lacking regarding accuracy of CT over plain film radiography.   Electronically Signed   By: Rubye Oaks M.D.   On: 11/16/2014 01:05    Microbiology: Recent Results (from the past 240 hour(s))  Culture, Urine     Status: None   Collection Time: 11/29/14  4:05 PM  Result Value Ref Range Status   Specimen Description URINE, CATHETERIZED  Final   Special Requests NONE  Final   Colony Count NO GROWTH Performed at [facility]   Final   Culture NO GROWTH Performed at [facility]   Final   Report Status 11/30/2014 FINAL  Final  MRSA PCR Screening     Status: None   Collection Time: 12/01/14  6:25 PM  Result Value Ref Range Status   MRSA by PCR NEGATIVE NEGATIVE Final    Comment:        The GeneXpert MRSA Assay (FDA approved for NASAL specimens only), is one component of a comprehensive MRSA colonization surveillance program. It is not intended to diagnose MRSA infection nor to guide or monitor treatment for MRSA infections.      Labs: Basic Metabolic Panel:  Recent Labs Lab 11/29/14 1732  11/30/14 0052 11/30/14 0439 12/01/14 0432 12/02/14 0631 12/03/14 0511  NA  --   < > 146* CANCELLED BY LAB  148* 145 139 136  K  --   < > 3.9 3.7 3.6 3.5 3.5  CL  --   < > 116* 117* 112 108 105  CO2  --   < > GLUCOSE  --   < > 108* 97 93 103* 98  BUN  --   < > 47* 46* 42* 38* 38*  CREATININE  --   < > 2.17* 2.23* 2.09* 2.01*  1.90*  CALCIUM  --   < > 7.8* 8.0* 7.8* 7.7* 7.7*  MG 2.3  --   --   --   --   --   --   < > = values in this interval not displayed. Liver Function Tests: No results for input(s): AST, ALT, ALKPHOS, BILITOT, PROT, ALBUMIN in the last 168 hours. No results for input(s): LIPASE, AMYLASE in the last 168 hours. No results for input(s): AMMONIA in the last 168 hours. CBC:  Recent Labs Lab 11/29/14 1405 11/30/14 0439  WBC 10.1 7.1  NEUTROABS 8.8*  --   HGB 11.1* 10.6*  HCT 34.4* 32.3*  MCV 105.5* 104.5*  PLT 163 182   Cardiac Enzymes:  Recent Labs Lab 11/29/14 1405 11/29/14 1732 11/29/14 2202 11/30/14 0439  TROPONINI 0.38* 0.37* 0.35* 0.32*   BNP: BNP (last 3 results)  Recent Labs  11/29/14 1405  BNP 381.0*    ProBNP (last 3 results) No results for input(s): PROBNP in the last 8760 hours.  CBG:  Recent Labs Lab 12/01/14 2202  GLUCAP 107*    Principal Problem:   Electrolyte imbalance Active Problems:   Rib fracture   CHF (congestive heart failure)   Elevated troponin   Protein-calorie malnutrition, severe   Physical deconditioning   Chronic a-fib   Anemia   HLD (hyperlipidemia)   Acute on chronic diastolic congestive heart failure   Time coordinating discharge: 45 minutes  Signed:  Butch Penny, MD 01-03-15, 6:08 AM

## 2014-12-04 NOTE — Progress Notes (Signed)
[facility]  She verbalized understanding.  Patient left the floor via stretcher with staff and packet in stable condition.

## 2014-12-04 NOTE — Clinical Social Work Note (Signed)
[facility], son Deniece PortelaWayne, and facility aware and agreeable. Will transfer with staff.  Derenda FennelKara Hyde [place], [place] [phone]

## 2014-12-11 ENCOUNTER — Encounter (HOSPITAL_COMMUNITY)
Admission: RE | Admit: 2014-12-11 | Discharge: 2014-12-11 | Disposition: A | Discharge status: Home / Self Care | Payer: Medicare Other | Source: Skilled Nursing Facility | Attending: Family Medicine | Admitting: Family Medicine

## 2014-12-11 LAB — BASIC METABOLIC PANEL
Anion gap: 9 (ref 5–15)
BUN: 50 mg/dL — AB (ref 6–23)
CALCIUM: 8.2 mg/dL — AB (ref 8.4–10.5)
CHLORIDE: 111 mmol/L (ref 96–112)
CO2: 27 mmol/L (ref 19–32)
CREATININE: 2.35 mg/dL — AB (ref 0.50–1.35)
GFR calc Af Amer: 25 mL/min — ABNORMAL LOW (ref >90)
GFR calc non Af Amer: 21 mL/min — ABNORMAL LOW (ref >90)
Glucose, Bld: 90 mg/dL (ref 70–99)
Potassium: 2.9 mmol/L — ABNORMAL LOW (ref 3.5–5.1)
Sodium: 147 mmol/L — ABNORMAL HIGH (ref 135–145)

## 2014-12-13 ENCOUNTER — Encounter (HOSPITAL_COMMUNITY)
Admission: RE | Admit: 2014-12-13 | Discharge: 2014-12-13 | Disposition: A | Discharge status: Home / Self Care | Payer: Medicare Other | Source: Skilled Nursing Facility | Attending: Family Medicine | Admitting: Family Medicine

## 2014-12-13 LAB — BASIC METABOLIC PANEL
Anion gap: 8 (ref 5–15)
BUN: 61 mg/dL — ABNORMAL HIGH (ref 6–23)
CHLORIDE: 114 mmol/L — AB (ref 96–112)
CO2: 27 mmol/L (ref 19–32)
Calcium: 8.2 mg/dL — ABNORMAL LOW (ref 8.4–10.5)
Creatinine, Ser: 2.43 mg/dL — ABNORMAL HIGH (ref 0.50–1.35)
GFR calc Af Amer: 24 mL/min — ABNORMAL LOW (ref >90)
GFR calc non Af Amer: 20 mL/min — ABNORMAL LOW (ref >90)
Glucose, Bld: 89 mg/dL (ref 70–99)
POTASSIUM: 3.3 mmol/L — AB (ref 3.5–5.1)
SODIUM: 149 mmol/L — AB (ref 135–145)

## 2014-12-24 DEATH — deceased

## 2015-01-24 DEATH — deceased

## 2016-02-13 IMAGING — CR DG CHEST 1V PORT
1 series · 1 of 1 positions shown · non-contrast
Comparison: 11/29/2014

CLINICAL DATA: Left lower lobe pneumonia.

EXAM:
PORTABLE CHEST - 1 VIEW

[portable]
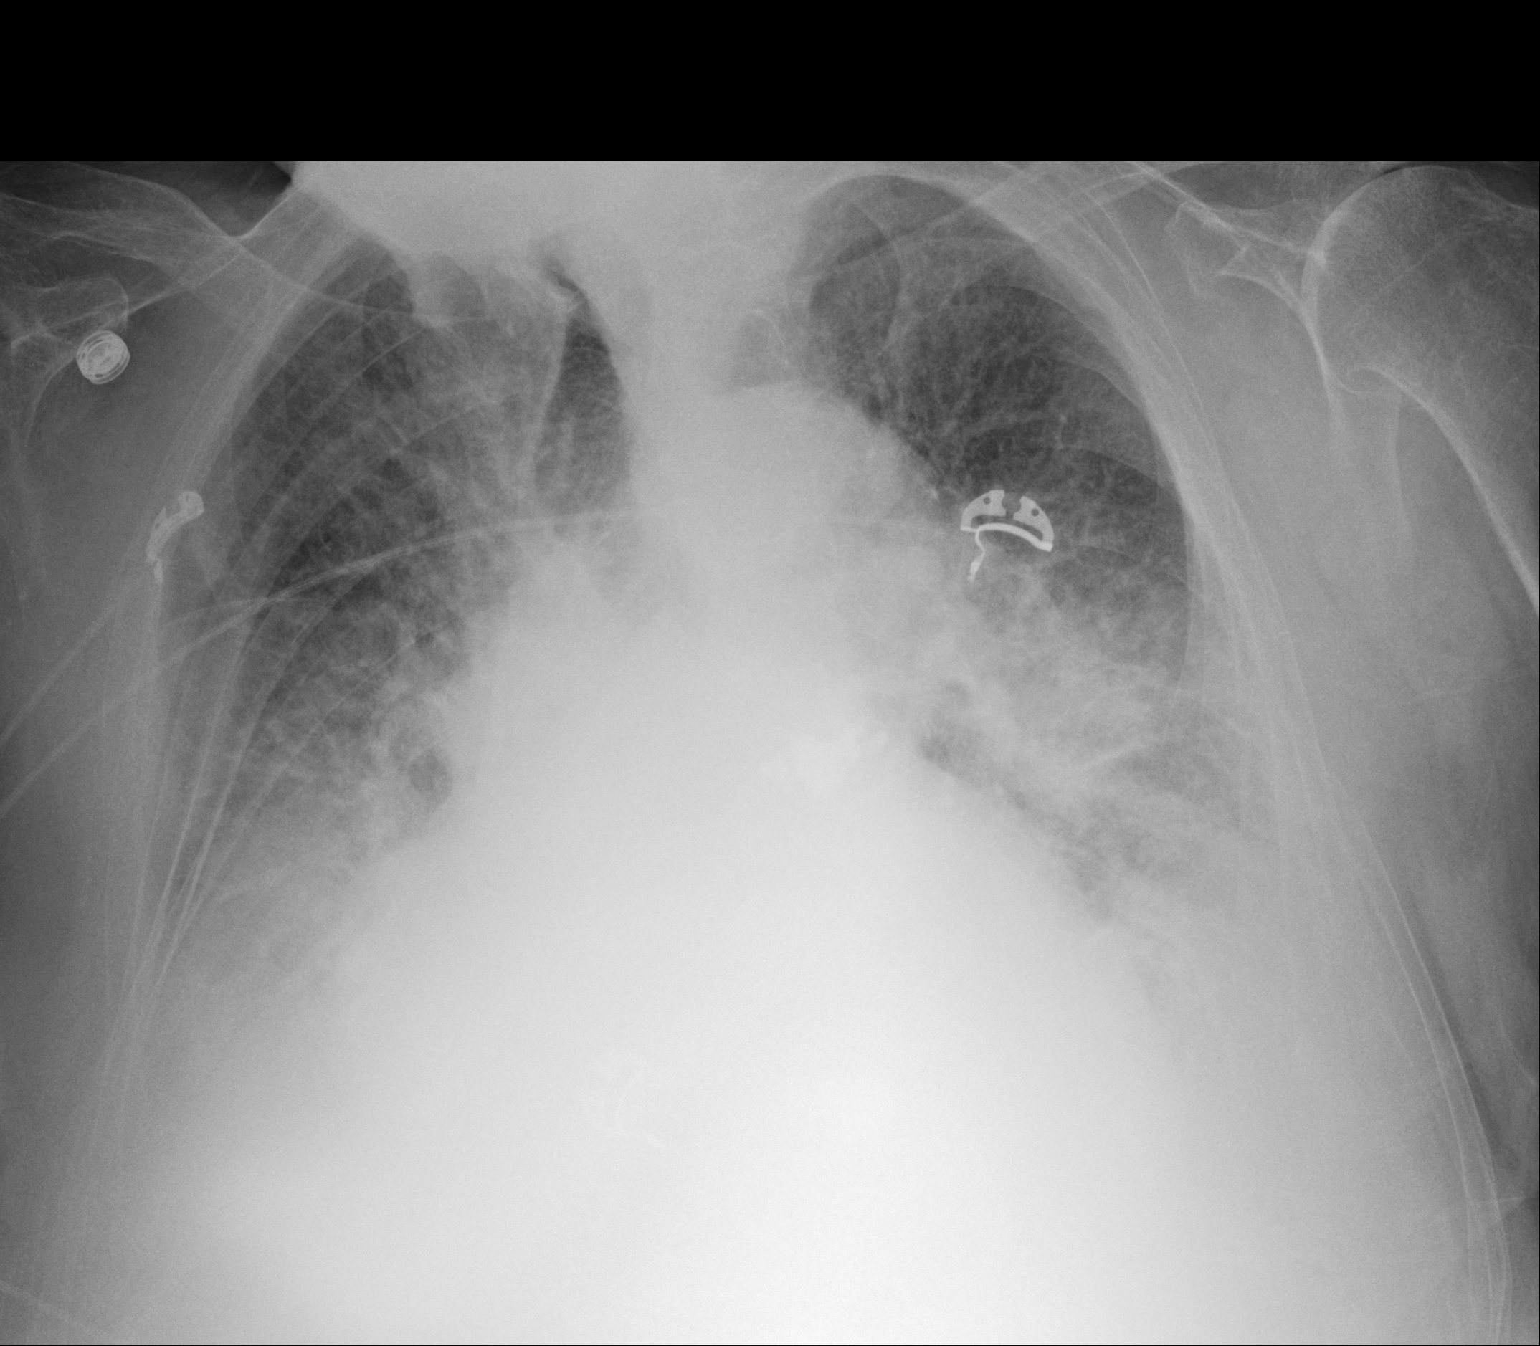

[1 of 1 positions shown; findings below may reference images not displayed]

FINDINGS: There is bilateral diffuse interstitial thickening. There are
bilateral small pleural effusions. There is bilateral lower lobe and
perihilar airspace disease. There is no pneumothorax. There is
stable cardiomegaly. Is no acute osseous abnormality.
IMPRESSION: 1. Bilateral interstitial thickening, patchy airspace opacities and
pleural effusions with cardiomegaly. Differential diagnosis includes
florid pulmonary edema versus multilobar pneumonia.
